# Patient Record
Sex: Male | Born: 1940 | Race: White | Hispanic: No | Marital: Married | Smoking: Former smoker
Health system: Southern US, Community
[De-identification: ages and names within clinical notes are randomized; demographics above are authoritative.]

## PROBLEM LIST (undated history)

## (undated) DIAGNOSIS — N400 Enlarged prostate without lower urinary tract symptoms: Secondary | ICD-10-CM

## (undated) DIAGNOSIS — K219 Gastro-esophageal reflux disease without esophagitis: Secondary | ICD-10-CM

## (undated) HISTORY — DX: Benign prostatic hyperplasia without lower urinary tract symptoms: N40.0

## (undated) HISTORY — PX: HERNIA REPAIR: SHX51

## (undated) HISTORY — DX: Gastro-esophageal reflux disease without esophagitis: K21.9

---

## 1987-08-23 HISTORY — PX: LAMINECTOMY: SHX219

## 2005-06-21 ENCOUNTER — Ambulatory Visit: Payer: Self-pay | Admitting: Internal Medicine

## 2005-06-23 ENCOUNTER — Ambulatory Visit: Payer: Self-pay

## 2005-11-15 ENCOUNTER — Ambulatory Visit: Payer: Self-pay | Admitting: General Surgery

## 2006-08-30 ENCOUNTER — Ambulatory Visit: Payer: Self-pay | Admitting: Surgery

## 2007-01-05 ENCOUNTER — Ambulatory Visit: Payer: Self-pay | Admitting: Unknown Physician Specialty

## 2007-05-17 ENCOUNTER — Ambulatory Visit: Payer: Self-pay | Admitting: Internal Medicine

## 2008-11-26 ENCOUNTER — Ambulatory Visit: Payer: Self-pay | Admitting: Internal Medicine

## 2009-04-29 ENCOUNTER — Ambulatory Visit: Payer: Self-pay | Admitting: Surgery

## 2009-05-04 ENCOUNTER — Ambulatory Visit: Payer: Self-pay | Admitting: Surgery

## 2012-06-05 ENCOUNTER — Ambulatory Visit: Payer: Self-pay | Admitting: Unknown Physician Specialty

## 2014-01-20 DIAGNOSIS — K279 Peptic ulcer, site unspecified, unspecified as acute or chronic, without hemorrhage or perforation: Secondary | ICD-10-CM | POA: Insufficient documentation

## 2014-01-20 DIAGNOSIS — E7800 Pure hypercholesterolemia, unspecified: Secondary | ICD-10-CM | POA: Insufficient documentation

## 2016-05-18 ENCOUNTER — Encounter: Payer: Self-pay | Admitting: Urology

## 2016-05-18 ENCOUNTER — Ambulatory Visit (INDEPENDENT_AMBULATORY_CARE_PROVIDER_SITE_OTHER): Payer: Medicare Other | Admitting: Urology

## 2016-05-18 VITALS — BP 135/82 | HR 69 | Ht 71.0 in | Wt 174.5 lb

## 2016-05-18 DIAGNOSIS — R972 Elevated prostate specific antigen [PSA]: Secondary | ICD-10-CM

## 2016-05-18 DIAGNOSIS — N138 Other obstructive and reflux uropathy: Secondary | ICD-10-CM

## 2016-05-18 DIAGNOSIS — N401 Enlarged prostate with lower urinary tract symptoms: Secondary | ICD-10-CM | POA: Diagnosis not present

## 2016-05-18 DIAGNOSIS — R3912 Poor urinary stream: Secondary | ICD-10-CM | POA: Diagnosis not present

## 2016-05-18 DIAGNOSIS — R351 Nocturia: Secondary | ICD-10-CM

## 2016-05-18 MED ORDER — ALFUZOSIN HCL ER 10 MG PO TB24: 10.0000 mg | ORAL_TABLET | Freq: Every day | ORAL | 11 refills | Status: DC

## 2016-05-18 NOTE — Progress Notes (Signed)
05/18/2016 2:15 PM   Tim RuizJohn Marylu LundM Barrett 01/02/1941 [id number]  Referring provider: Rafael BihariJohn B Walker III, MD [phone] [facility] [place], [place] [address]  Chief Complaint  Patient presents with  . New Patient (Initial Visit)    nocturia, elevated PSA     HPI: Referred for elevated PSA. His 04/21/2016 PSA was 4.37. He does not recall prior PSA levels or PSA screening. No FH PCa.   He has a h/o BPH with LUTS (dysuria, weak stream, hesitancy, intermittent, straining, nocturia, frequency) and ED. He was started on daily Cialis Dec 2016. He also tried tamsulosin, but stopped it due to side effects (made it him feel "bad"). He stopped daily Cialis - no change in symptoms. He's still sexually active. He stays active and works out frequently.    He could not leave a specimen today.    PMH: No past medical history on file.  Surgical History: No past surgical history on file.  Home Medications:    Medication List       Accurate as of 05/18/16  2:15 PM. Always use your most recent med list.          esomeprazole 20 MG capsule Commonly known as:  NEXIUM Take by mouth.       Allergies: No Known Allergies  Family History: No family history on file.  Social History:  has no tobacco, alcohol, and drug history on file.  ROS: UROLOGY Frequent Urination?: Yes Hard to postpone urination?: No Burning/pain with urination?: Yes Get up at night to urinate?: Yes Leakage of urine?: No Urine stream starts and stops?: Yes Trouble starting stream?: Yes Do you have to strain to urinate?: Yes Blood in urine?: No Urinary tract infection?: No Sexually transmitted disease?: No Injury to kidneys or bladder?: No Painful intercourse?: No Weak stream?: Yes Erection problems?: No Penile pain?: No  Gastrointestinal Nausea?: No Vomiting?: No Indigestion/heartburn?: Yes Diarrhea?: No Constipation?: No  Constitutional Fever: No Night sweats?: No Weight loss?:  Yes Fatigue?: No  Skin Skin rash/lesions?: No Itching?: No  Eyes Blurred vision?: No Double vision?: No  Ears/Nose/Throat Sore throat?: Yes Sinus problems?: Yes  Hematologic/Lymphatic Swollen glands?: No Easy bruising?: No  Cardiovascular Leg swelling?: No Chest pain?: No  Respiratory Cough?: No Shortness of breath?: No  Endocrine Excessive thirst?: Yes  Musculoskeletal Back pain?: No Joint pain?: No  Neurological Headaches?: No Dizziness?: No  Psychologic Depression?: No Anxiety?: No  Physical Exam: BP 135/82   Pulse 69   Ht 5\' 11"  (1.803 Tim)   Wt 79.2 kg (174 lb 8 oz)   BMI 24.34 kg/Tim   Constitutional:  Alert and oriented, No acute distress. HEENT: [place] AT, moist mucus membranes.  Trachea midline, no masses. Cardiovascular: No clubbing, cyanosis, or edema. Respiratory: Normal respiratory effort, no increased work of breathing. GI: Abdomen is soft, nontender, nondistended, no abdominal masses Skin: No rashes, bruises or suspicious lesions. Lymph: No cervical or inguinal adenopathy. Neurologic: Grossly intact, no focal deficits, moving all 4 extremities. Psychiatric: Normal mood and affect. DRE: prostate about 80 grams, smooth, no hard area or nodule.   Laboratory Data: No results found for: WBC, HGB, HCT, MCV, PLT  No results found for: CREATININE  No results found for: PSA  No results found for: TESTOSTERONE  No results found for: HGBA1C  Urinalysis No results found for: COLORURINE, APPEARANCEUR, LABSPEC, PHURINE, GLUCOSEU, HGBUR, BILIRUBINUR, KETONESUR, PROTEINUR, UROBILINOGEN, NITRITE, LEUKOCYTESUR  Pertinent Imaging:  Assessment & Plan:    1) PSA -  pt will normal DRE today and we discussed certainly a normal PSA density. We discussed nature of PSA elevation - benign vs malignant. Discussed psa levels and age specific levels. Discussed nature r/b of surveillance, other lab tests, MRI and prostate bx. Discussed management of PCa might  include AS vs treatment (surgery or xrt). Pt elects to continue surveillance. He already has LUTS and ED and would be hesitant to do anything to interfere with QOL and sexual fxn.   2) BPH with LUTS - discussed nature r/b of some other options alfuzosin, 5ARI, procedures. He elected to start alfuzosin. He does not want 5ARI as he has a good libido and is sexually active.   See back in 3 mo for PSA recheck, symptom check and PVR.   There are no diagnoses linked to this encounter.  No Follow-up on file.  Jerilee Field, MD  [facility] [address], [address] [place], [place] [address] [phone]

## 2016-08-01 ENCOUNTER — Ambulatory Visit (INDEPENDENT_AMBULATORY_CARE_PROVIDER_SITE_OTHER): Payer: Medicare Other | Admitting: Urology

## 2016-08-01 VITALS — BP 155/99 | HR 71 | Ht 71.0 in | Wt 180.0 lb

## 2016-08-01 DIAGNOSIS — R3912 Poor urinary stream: Secondary | ICD-10-CM

## 2016-08-01 DIAGNOSIS — N401 Enlarged prostate with lower urinary tract symptoms: Secondary | ICD-10-CM

## 2016-08-01 DIAGNOSIS — R972 Elevated prostate specific antigen [PSA]: Secondary | ICD-10-CM

## 2016-08-01 DIAGNOSIS — N138 Other obstructive and reflux uropathy: Secondary | ICD-10-CM

## 2016-08-01 LAB — BLADDER SCAN AMB NON-IMAGING

## 2016-08-01 NOTE — Progress Notes (Signed)
08/01/2016 2:54 PM   Tim RuizJohn Marylu LundM Barrett 04/07/1941 [id number]  Referring provider: Rafael BihariJohn B Walker III, MD [phone] [facility] [address] [facility] [place], [place] [address]  Chief Complaint  Patient presents with  . Benign Prostatic Hypertrophy    74month    HPI: 1)  elevated PSA - His 04/21/2016 PSA was 4.37. He does not recall prior PSA levels or PSA screening. No FH PCa. His DRE was normal 04/2016, prostate ~80 grams.   2) BPH with LUTS (dysuria, weak stream, hesitancy, intermittent, straining, nocturia, frequency) and ED. He was started on daily Cialis Dec 2016. He also tried tamsulosin, but stopped it due to side effects (made it him feel "bad"). He stopped daily Cialis - no change in symptoms. He's still sexually active. He stays active and works out frequently. He tried alfuzosin 9/2017l   Today, Tim Barrett is seen for the above. He tried Alfuzosin and it significantly improved his voiding symptoms so much so he only takes it when he needs it (every 4-5 days). Post void today is 177 mL. He could not leave a urine specimen.    PMH: Past Medical History:  Diagnosis Date  . Acid reflux     Surgical History: Past Surgical History:  Procedure Laterality Date  . HERNIA REPAIR     x4  . LAMINECTOMY  1989    Home Medications:    Medication List       Accurate as of 08/01/16  2:54 PM. Always use your most recent med list.          alfuzosin 10 MG 24 hr tablet Commonly known as:  UROXATRAL Take 1 tablet (10 mg total) by mouth daily with breakfast.   esomeprazole 20 MG capsule Commonly known as:  NEXIUM Take by mouth.       Allergies: No Known Allergies  Family History: Family History  Problem Relation Age of Onset  . Prostate cancer Neg Hx   . Hematuria Neg Hx     Social History:  reports that he quit smoking about 15 years ago. He has never used smokeless tobacco. He reports that he does not drink alcohol or use drugs.  ROS: UROLOGY Frequent Urination?:  Yes Hard to postpone urination?: No Burning/pain with urination?: No Get up at night to urinate?: Yes Leakage of urine?: No Urine stream starts and stops?: Yes Trouble starting stream?: Yes Do you have to strain to urinate?: No Blood in urine?: No Urinary tract infection?: No Sexually transmitted disease?: No Injury to kidneys or bladder?: No Painful intercourse?: No Weak stream?: No Erection problems?: No Penile pain?: No  Gastrointestinal Nausea?: No Vomiting?: No Indigestion/heartburn?: No Diarrhea?: No Constipation?: No  Constitutional Fever: No Night sweats?: No Weight loss?: No Fatigue?: No  Skin Skin rash/lesions?: No Itching?: No  Eyes Blurred vision?: No Double vision?: No  Ears/Nose/Throat Sore throat?: No Sinus problems?: Yes  Hematologic/Lymphatic Swollen glands?: No Easy bruising?: No  Cardiovascular Leg swelling?: No Chest pain?: No  Respiratory Cough?: No Shortness of breath?: No  Endocrine Excessive thirst?: No  Musculoskeletal Back pain?: No Joint pain?: No  Neurological Headaches?: No Dizziness?: No  Psychologic Depression?: No Anxiety?: No  Physical Exam: BP (!) 155/99   Pulse 71   Ht 5\' 11"  (1.803 Tim)   Wt 81.6 kg (180 lb)   BMI 25.10 kg/Tim   Constitutional:  Alert and oriented, No acute distress. HEENT: [place] AT, moist mucus membranes.  Trachea midline, no masses. Cardiovascular: No clubbing, cyanosis, or edema.  Respiratory: Normal respiratory effort, no increased work of breathing. GI: Abdomen is soft, nontender, nondistended, no abdominal masses Skin: No rashes, bruises or suspicious lesions. Lymph: No cervical or inguinal adenopathy. Neurologic: Grossly intact, no focal deficits, moving all 4 extremities. Psychiatric: Normal mood and affect.  Laboratory Data: No results found for: WBC, HGB, HCT, MCV, PLT  No results found for: CREATININE  No results found for: PSA  No results found for: TESTOSTERONE  No  results found for: HGBA1C  Urinalysis No results found for: COLORURINE, APPEARANCEUR, LABSPEC, PHURINE, GLUCOSEU, HGBUR, BILIRUBINUR, KETONESUR, PROTEINUR, UROBILINOGEN, NITRITE, LEUKOCYTESUR  Pertinent Imaging:  Assessment & Plan:    1. BPH with obstruction/lower urinary tract symptoms Improved on alfuzosin. Continue. PVR reasonable.  - PSA - BLADDER SCAN AMB NON-IMAGING  2) elevated PSA - PSA was sent.   No Follow-up on file.  Jerilee FieldESKRIDGE, Skylan Lara, MD  [facility] [address], [address] [place], [place] [phone]

## 2016-08-02 ENCOUNTER — Telehealth: Payer: Self-pay

## 2016-08-02 DIAGNOSIS — R972 Elevated prostate specific antigen [PSA]: Secondary | ICD-10-CM

## 2016-08-02 LAB — PSA: PROSTATE SPECIFIC AG, SERUM: 5.2 ng/mL — AB (ref 0.0–4.0)

## 2016-08-02 NOTE — Telephone Encounter (Signed)
Tim Dadatthew Eskridge, MD  [name], LPN        Notify patient his PSA went up about a poin to 5.2. I would recommend a prostate biopsy, but if he wants to avoid that I would recheck the PSA in 6-8 weeks to ensure it remains stable or decreases.    Spoke with pt in reference to PSA results. Pt elected to recheck PSA in 8 weeks. Lab appt made and orders placed.

## 2016-08-11 ENCOUNTER — Ambulatory Visit: Payer: Medicare Other | Admitting: Urology

## 2016-10-03 ENCOUNTER — Other Ambulatory Visit: Payer: Medicare Other

## 2016-10-03 DIAGNOSIS — R972 Elevated prostate specific antigen [PSA]: Secondary | ICD-10-CM

## 2016-10-04 LAB — PSA: PROSTATE SPECIFIC AG, SERUM: 5.1 ng/mL — AB (ref 0.0–4.0)

## 2016-10-07 ENCOUNTER — Telehealth: Payer: Self-pay

## 2016-10-07 DIAGNOSIS — N401 Enlarged prostate with lower urinary tract symptoms: Secondary | ICD-10-CM

## 2016-10-07 NOTE — Telephone Encounter (Signed)
Tim FieldMatthew Eskridge, MD  Tim Latchhelsea C Watkins, LPN        PSA stable - this should be rechecked again in 3 months    Spoke to pt in reference to PSA results. Pt voiced understanding. Lab appt made and orders placed.

## 2017-01-04 ENCOUNTER — Other Ambulatory Visit: Payer: Medicare Other

## 2017-01-04 DIAGNOSIS — N401 Enlarged prostate with lower urinary tract symptoms: Secondary | ICD-10-CM

## 2017-01-05 LAB — PSA: Prostate Specific Ag, Serum: 4.8 ng/mL — ABNORMAL HIGH (ref 0.0–4.0)

## 2017-01-06 ENCOUNTER — Telehealth: Payer: Self-pay

## 2017-01-06 NOTE — Telephone Encounter (Signed)
Jerilee FieldEskridge, Matthew, MD  Skeet LatchWatkins, Chelsea C, LPN        Notify patient - PSA stable, a little lower. F/u as planned   Spoke with pt in reference to PSA results and f/u. Pt voiced understanding.

## 2017-01-31 ENCOUNTER — Encounter: Payer: Self-pay | Admitting: Urology

## 2017-01-31 ENCOUNTER — Ambulatory Visit (INDEPENDENT_AMBULATORY_CARE_PROVIDER_SITE_OTHER): Payer: Medicare Other | Admitting: Urology

## 2017-01-31 VITALS — BP 153/96 | HR 66 | Ht 71.0 in | Wt 179.9 lb

## 2017-01-31 DIAGNOSIS — N401 Enlarged prostate with lower urinary tract symptoms: Secondary | ICD-10-CM | POA: Diagnosis not present

## 2017-01-31 DIAGNOSIS — R350 Frequency of micturition: Secondary | ICD-10-CM | POA: Diagnosis not present

## 2017-01-31 DIAGNOSIS — N138 Other obstructive and reflux uropathy: Secondary | ICD-10-CM | POA: Diagnosis not present

## 2017-01-31 DIAGNOSIS — R972 Elevated prostate specific antigen [PSA]: Secondary | ICD-10-CM | POA: Diagnosis not present

## 2017-01-31 NOTE — Progress Notes (Signed)
.   01/31/2017 2:12 PM   Tim Barrett Tim Barrett 08-29-1940 [id number]  Referring provider: Rafael Bihari, MD [phone] Tim [facility] [address] [facility] [place], [place] [address]  Chief Complaint  Patient presents with  . Benign Prostatic Hypertrophy    6 month fpllow up   . Nocturia    HPI:  1)  elevated PSA - His 04/21/2016 PSA was 4.37. He does not recall prior PSA levels or PSA screening. No FH PCa. His DRE was normal 04/2016, prostate ~80 grams.  PSA hx: Aug 2017 PSA 4.37 Dec 2017 PSA 5.23 Sep 2016  PSA 5.20 Dec 2016 PSA 4.8    2) BPH with LUTS (dysuria, weak stream, hesitancy, intermittent, straining, nocturia, frequency)and ED. He was started on daily Cialis Dec 2016. He also tried tamsulosin, but stopped it due to side effects (made it him feel "bad"). He stopped daily Cialis - no change in symptoms. He's still sexually active. He stays active and works out frequently. He tried alfuzosin 04/2016. PVR was 177 mL.   The patient returns for the above. His lower urinary check symptoms have improved on alfuzosin and he is satisfied. It "took care of it". He still has occasional frequency and nocturia. His AUA symptom score there was dropped to 2, quality of life 2.  PMH: Past Medical History:  Diagnosis Date  . Acid reflux   . BPH (benign prostatic hyperplasia)     Surgical History: Past Surgical History:  Procedure Laterality Date  . HERNIA REPAIR     x4  . LAMINECTOMY  1989    Home Medications:  Allergies as of 01/31/2017   No Known Allergies     Medication List       Accurate as of 01/31/17  2:12 PM. Always use your most recent med list.          alfuzosin 10 MG 24 hr tablet Commonly known as:  UROXATRAL Take 1 tablet (10 mg total) by mouth daily with breakfast.   esomeprazole 20 MG capsule Commonly known as:  NEXIUM Take by mouth.       Allergies: No Known Allergies  Family History: Family History  Problem Relation Age of Onset  . Heart attack  Father   . Prostate cancer Neg Hx   . Hematuria Neg Hx   . Kidney cancer Neg Hx   . Bladder Cancer Neg Hx     Social History:  reports that he quit smoking about 15 years ago. He has never used smokeless tobacco. He reports that he does not drink alcohol or use drugs.  ROS: UROLOGY Frequent Urination?: Yes Hard to postpone urination?: No Burning/pain with urination?: No Get up at night to urinate?: Yes Leakage of urine?: No Urine stream starts and stops?: No Trouble starting stream?: No Do you have to strain to urinate?: No Blood in urine?: No Urinary tract infection?: No Sexually transmitted disease?: No Injury to kidneys or bladder?: No Painful intercourse?: No Weak stream?: No Erection problems?: No Penile pain?: No  Gastrointestinal Nausea?: No Vomiting?: No Indigestion/heartburn?: No Diarrhea?: No Constipation?: No  Constitutional Fever: No Night sweats?: No Weight loss?: No Fatigue?: No  Skin Skin rash/lesions?: No Itching?: No  Eyes Blurred vision?: No Double vision?: No  Ears/Nose/Throat Sore throat?: No Sinus problems?: Yes  Hematologic/Lymphatic Swollen glands?: No Easy bruising?: No  Cardiovascular Leg swelling?: No Chest pain?: No  Respiratory Cough?: No Shortness of breath?: No  Endocrine Excessive thirst?: No  Musculoskeletal Back pain?: No Joint pain?:  No  Neurological Headaches?: No Dizziness?: No  Psychologic Depression?: No Anxiety?: No  Physical Exam: BP (!) 153/96   Pulse 66   Ht 5\' 11"  (1.803 m)   Wt 81.6 kg (179 lb 14.4 oz)   BMI 25.09 kg/m   Constitutional:  Alert and oriented, No acute distress. HEENT: [place] AT, moist mucus membranes.  Trachea midline, no masses. Cardiovascular: No clubbing, cyanosis, or edema. Respiratory: Normal respiratory effort, no increased work of breathing. GI: Abdomen is soft, nontender, nondistended, no abdominal mass Skin: No rashes, bruises or suspicious lesions. Lymph: No  cervical or inguinal adenopathy. Neurologic: Grossly intact, no focal deficits, moving all 4 extremities. Psychiatric: Normal mood and affect.  Laboratory Data: No results found for: WBC, HGB, HCT, MCV, PLT  No results found for: CREATININE  No results found for: PSA  No results found for: TESTOSTERONE  No results found for: HGBA1C  Urinalysis No results found for: COLORURINE, APPEARANCEUR, LABSPEC, PHURINE, GLUCOSEU, HGBUR, BILIRUBINUR, KETONESUR, PROTEINUR, UROBILINOGEN, NITRITE, LEUKOCYTESUR   Assessment & Plan:    1) elevated PSA-we discussed his PSA remains elevated and the nature of elevated PSA-benign versus malignant. Again we discussed the nature risk and benefits of surveillance versus other lab tests MRI or biopsy. Looking at the PCP T data, min his age and was similar PSA levels had a low-grade biopsy positive at about 21% and high-grade at about 11%. Negative rate was 68%. He favors surveillance.   2) BPH-patient stable on alfuzosiin.  See back in 6 mo with a PSA prior.   There are no diagnoses linked to this encounter.  No Follow-up on file.  Jerilee FieldESKRIDGE, Anitra Doxtater, MD  [facility] [address], [address] [place], [place] [address] [phone]

## 2017-07-25 ENCOUNTER — Other Ambulatory Visit: Payer: Medicare Other

## 2017-08-01 ENCOUNTER — Ambulatory Visit: Payer: Medicare Other

## 2017-08-07 ENCOUNTER — Ambulatory Visit: Payer: Medicare Other

## 2018-02-06 ENCOUNTER — Telehealth: Payer: Self-pay | Admitting: Family Medicine

## 2018-02-06 NOTE — Telephone Encounter (Signed)
LMOM for patient to return call.

## 2018-02-06 NOTE — Telephone Encounter (Signed)
-----   Message from Jerilee FieldMatthew Eskridge, MD sent at 02/06/2018 11:22 AM EDT ----- Notify patient -- due for OV and PSA/DRE check   ----- Message ----- From: SYSTEM Sent: 02/05/2018  12:07 AM To: Jerilee FieldMatthew Eskridge, MD

## 2018-02-08 NOTE — Telephone Encounter (Signed)
lmom for pt to call for appt

## 2020-11-16 ENCOUNTER — Other Ambulatory Visit: Payer: Self-pay | Admitting: Internal Medicine

## 2020-11-16 DIAGNOSIS — M899 Disorder of bone, unspecified: Secondary | ICD-10-CM

## 2020-12-11 ENCOUNTER — Ambulatory Visit
Admission: RE | Admit: 2020-12-11 | Discharge: 2020-12-11 | Disposition: A | Discharge status: Home / Self Care | Payer: Medicare PPO | Source: Ambulatory Visit | Attending: Internal Medicine | Admitting: Internal Medicine

## 2020-12-11 ENCOUNTER — Other Ambulatory Visit: Payer: Self-pay

## 2020-12-11 DIAGNOSIS — R9082 White matter disease, unspecified: Secondary | ICD-10-CM | POA: Diagnosis not present

## 2020-12-11 DIAGNOSIS — G319 Degenerative disease of nervous system, unspecified: Secondary | ICD-10-CM | POA: Diagnosis not present

## 2020-12-11 DIAGNOSIS — M899 Disorder of bone, unspecified: Secondary | ICD-10-CM | POA: Diagnosis not present

## 2022-09-17 IMAGING — CT CT HEAD W/O CM
4 series · 15 of 47 positions shown, 17 images · non-contrast
Comparison: None.

CLINICAL DATA: Bump on head

EXAM:
CT HEAD WITHOUT CONTRAST
TECHNIQUE: Contiguous axial images were obtained from the base of the skull
through the vertex without intravenous contrast.

[Series 2: axial st head 5.00 ax · axial · 0.37mm/px · z∈[-553,-437]mm · 7 of 38 slices shown, 9 images]
[im 5/38  brain]
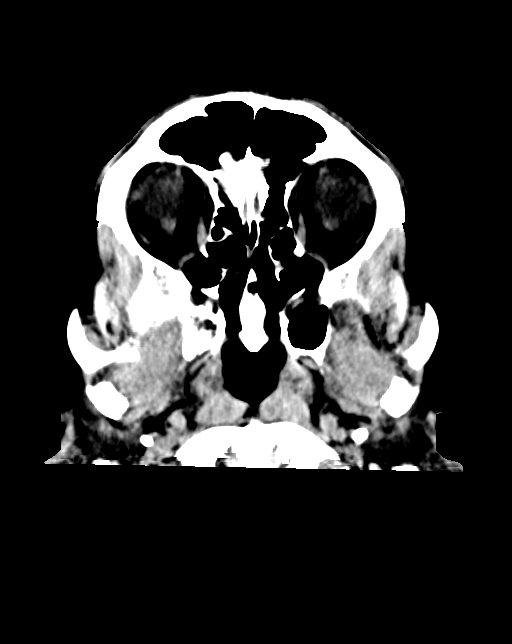
[im 5/38  bone]
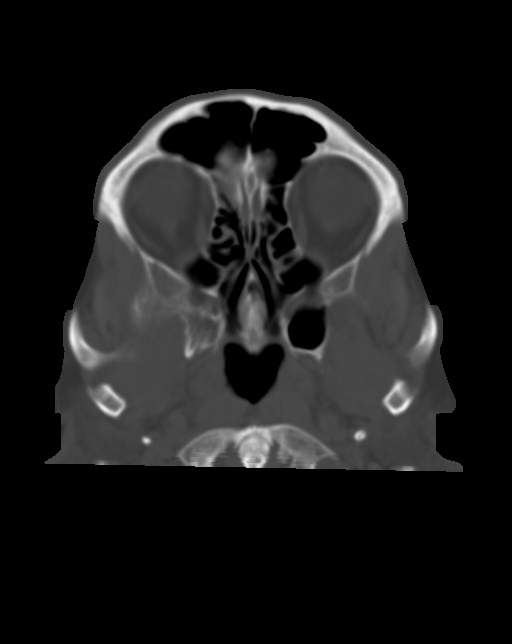
[im 10/38  brain]
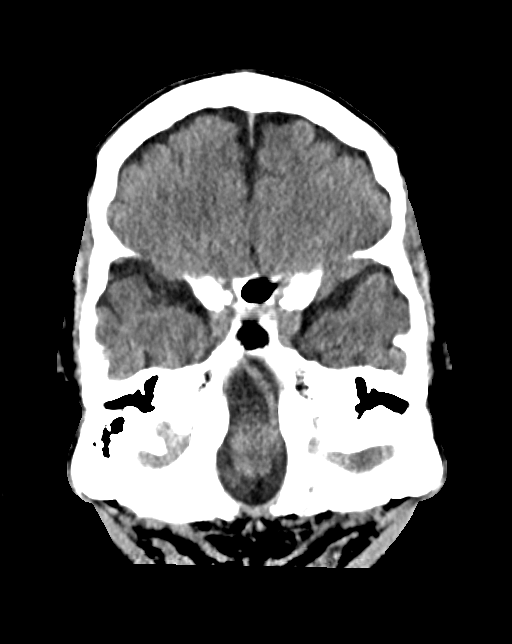
[im 14/38  brain]
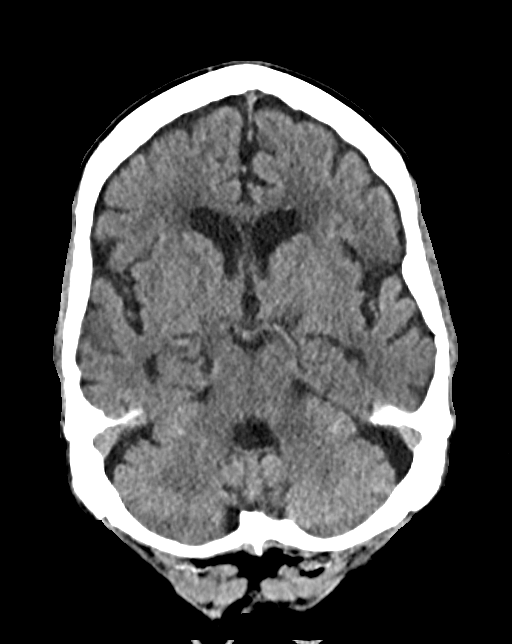
[im 19/38  brain]
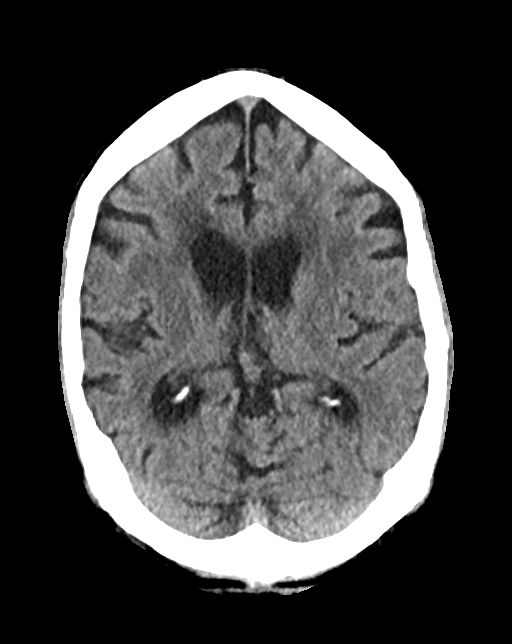
[im 24/38  brain]
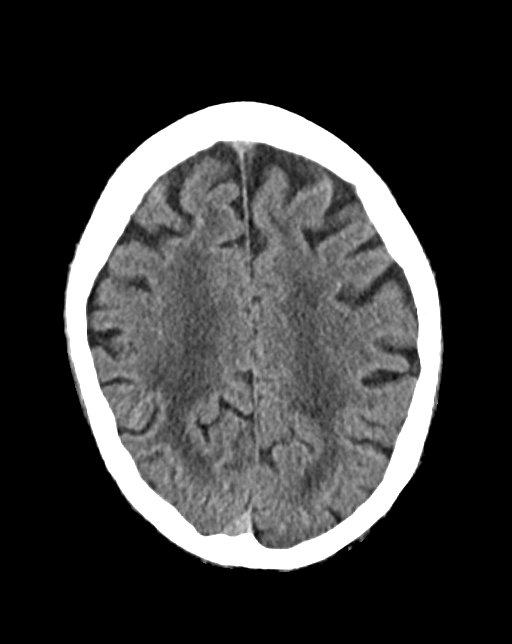
[im 24/38  bone]
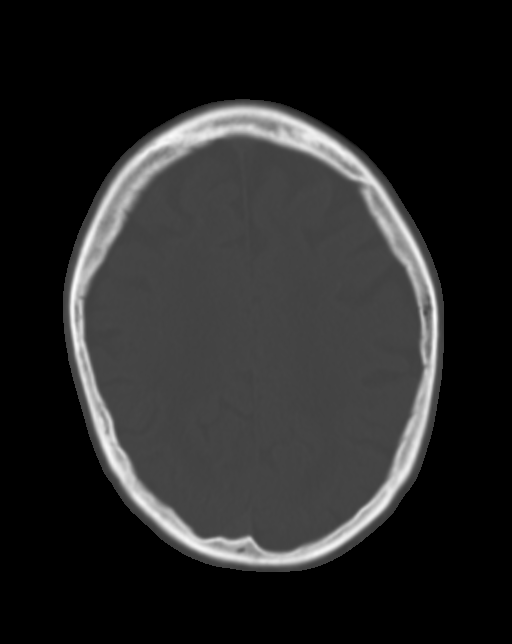
[im 28/38  brain]
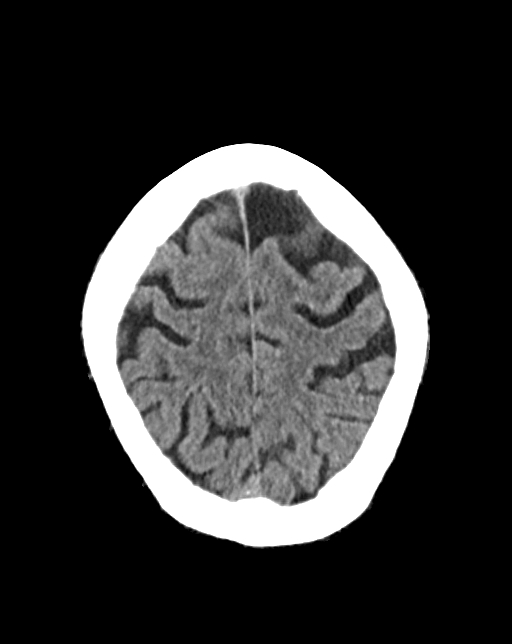
[im 33/38  brain]
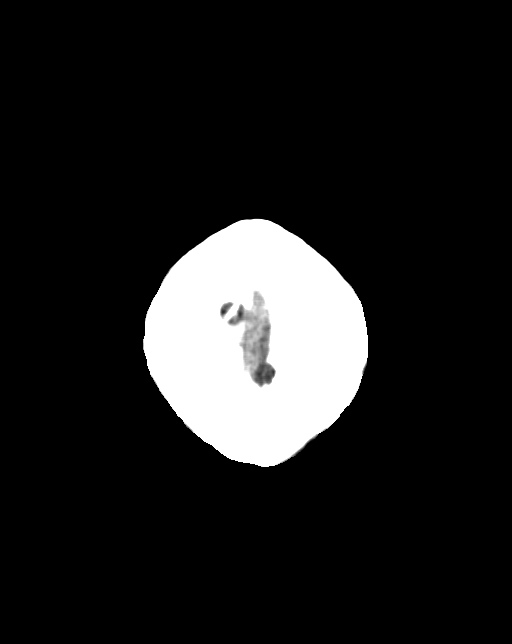

[Series 4: bone windows head 2.00 ax · axial · 0.37mm/px · z∈[-555,-539]mm · 2 of 96 slices shown]
[im 10/96  bone]
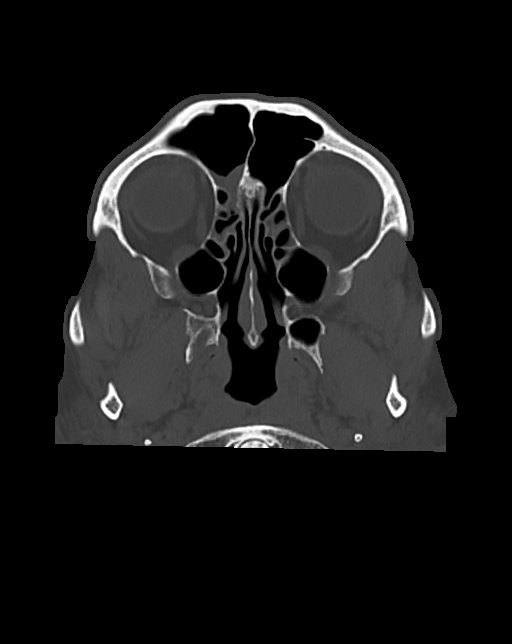
[im 20/96  bone]
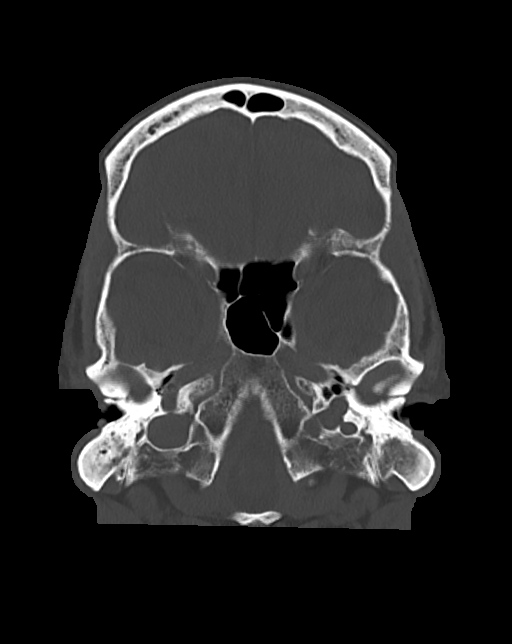

[Series 6: coronals head 3.00 cor · coronal · 0.37mm/px · 3 of 79 slices shown]
[im 32/79  brain]
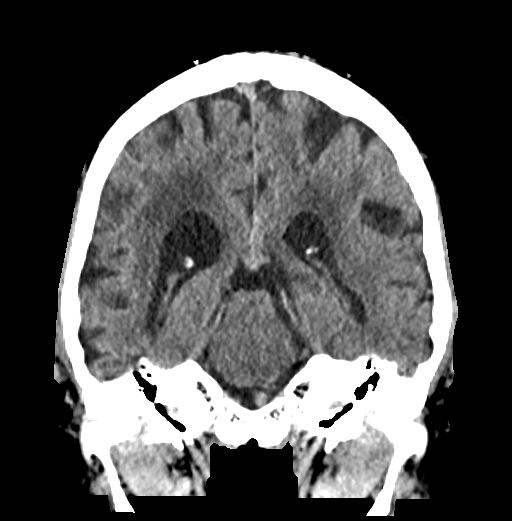
[im 37/79  brain]
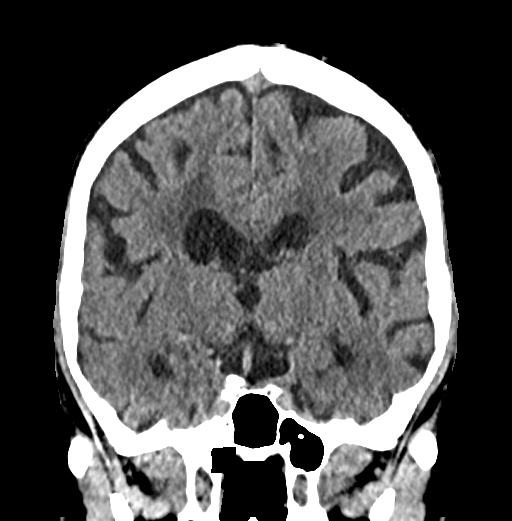
[im 42/79  brain]
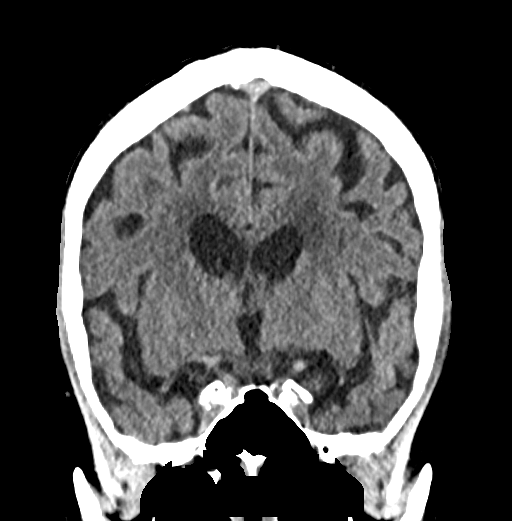

[Series 8: sagittals head 3.00 sag · sagittal · 0.38mm/px · 3 of 63 slices shown]
[im 21/63  brain]
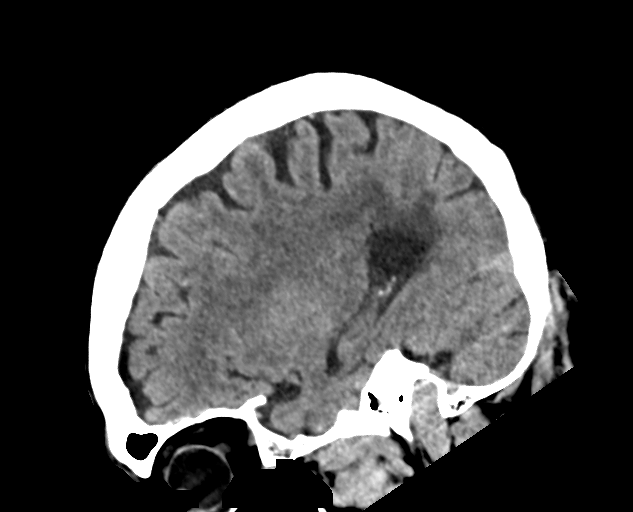
[im 32/63  brain]
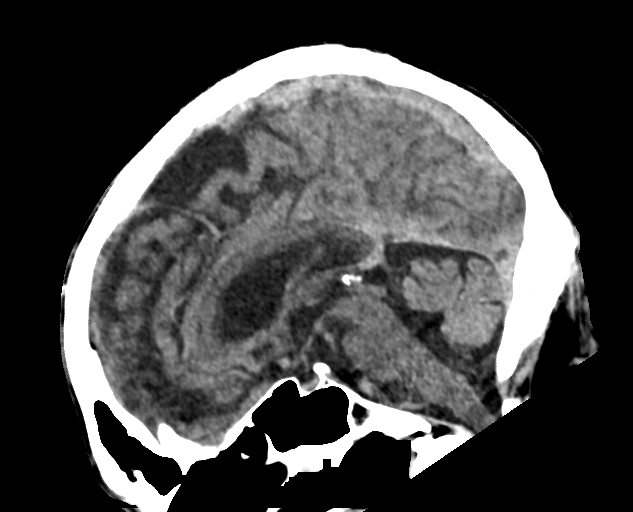
[im 42/63  brain]
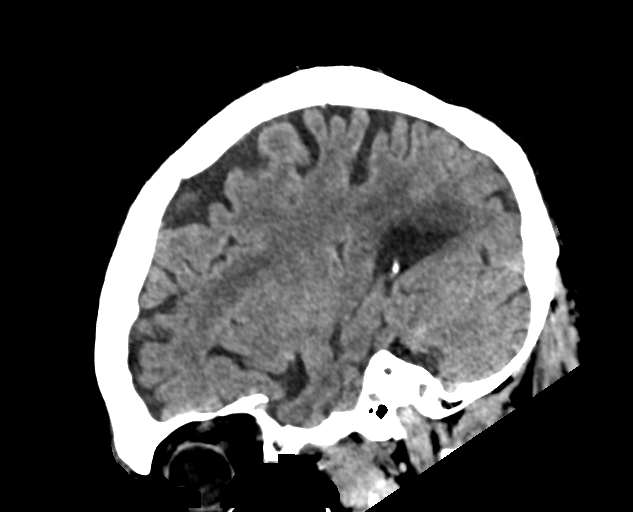

[15 of 47 positions shown; findings below may reference images not displayed]

FINDINGS: Brain: Diffuse parenchymal atrophy. Patchy areas of hypoattenuation
in deep and periventricular white matter bilaterally. Negative for
acute intracranial hemorrhage, mass lesion, acute infarction,
midline shift, or mass-effect. Acute infarct may be inapparent on
noncontrast CT. Ventricles and sulci symmetric.

Vascular: Atherosclerotic and physiologic intracranial
calcifications.

Skull: Normal. Negative for fracture or focal lesion.

Sinuses/Orbits: No acute finding.

Other: None
IMPRESSION: 1. Negative for bleed or other acute intracranial process.
2. Atrophy and nonspecific white matter changes.

## 2022-10-14 ENCOUNTER — Emergency Department: Payer: Medicare PPO

## 2022-10-14 ENCOUNTER — Inpatient Hospital Stay
Admission: EM | Admit: 2022-10-14 | Discharge: 2022-11-02 | DRG: 884 | Disposition: A | Discharge status: Hospice | Payer: Medicare PPO | Attending: Internal Medicine | Admitting: Internal Medicine

## 2022-10-14 DIAGNOSIS — I700 Atherosclerosis of aorta: Secondary | ICD-10-CM | POA: Diagnosis present

## 2022-10-14 DIAGNOSIS — S21112A Laceration without foreign body of left front wall of thorax without penetration into thoracic cavity, initial encounter: Secondary | ICD-10-CM | POA: Diagnosis not present

## 2022-10-14 DIAGNOSIS — S71111A Laceration without foreign body, right thigh, initial encounter: Secondary | ICD-10-CM | POA: Diagnosis not present

## 2022-10-14 DIAGNOSIS — Z23 Encounter for immunization: Secondary | ICD-10-CM

## 2022-10-14 DIAGNOSIS — E870 Hyperosmolality and hypernatremia: Secondary | ICD-10-CM | POA: Diagnosis not present

## 2022-10-14 DIAGNOSIS — S51812A Laceration without foreign body of left forearm, initial encounter: Secondary | ICD-10-CM | POA: Diagnosis present

## 2022-10-14 DIAGNOSIS — E785 Hyperlipidemia, unspecified: Secondary | ICD-10-CM | POA: Diagnosis present

## 2022-10-14 DIAGNOSIS — F03911 Unspecified dementia, unspecified severity, with agitation: Secondary | ICD-10-CM | POA: Diagnosis present

## 2022-10-14 DIAGNOSIS — N179 Acute kidney failure, unspecified: Secondary | ICD-10-CM | POA: Diagnosis present

## 2022-10-14 DIAGNOSIS — Z7982 Long term (current) use of aspirin: Secondary | ICD-10-CM

## 2022-10-14 DIAGNOSIS — Z515 Encounter for palliative care: Secondary | ICD-10-CM

## 2022-10-14 DIAGNOSIS — F03918 Unspecified dementia, unspecified severity, with other behavioral disturbance: Secondary | ICD-10-CM | POA: Diagnosis not present

## 2022-10-14 DIAGNOSIS — E876 Hypokalemia: Secondary | ICD-10-CM | POA: Diagnosis present

## 2022-10-14 DIAGNOSIS — D72829 Elevated white blood cell count, unspecified: Secondary | ICD-10-CM | POA: Diagnosis present

## 2022-10-14 DIAGNOSIS — K279 Peptic ulcer, site unspecified, unspecified as acute or chronic, without hemorrhage or perforation: Secondary | ICD-10-CM | POA: Diagnosis present

## 2022-10-14 DIAGNOSIS — Z79899 Other long term (current) drug therapy: Secondary | ICD-10-CM

## 2022-10-14 DIAGNOSIS — I2510 Atherosclerotic heart disease of native coronary artery without angina pectoris: Secondary | ICD-10-CM | POA: Diagnosis present

## 2022-10-14 DIAGNOSIS — Z882 Allergy status to sulfonamides status: Secondary | ICD-10-CM

## 2022-10-14 DIAGNOSIS — E860 Dehydration: Secondary | ICD-10-CM | POA: Diagnosis present

## 2022-10-14 DIAGNOSIS — T1491XA Suicide attempt, initial encounter: Secondary | ICD-10-CM | POA: Diagnosis present

## 2022-10-14 DIAGNOSIS — Z1152 Encounter for screening for COVID-19: Secondary | ICD-10-CM

## 2022-10-14 DIAGNOSIS — Z87891 Personal history of nicotine dependence: Secondary | ICD-10-CM

## 2022-10-14 DIAGNOSIS — X789XXA Intentional self-harm by unspecified sharp object, initial encounter: Secondary | ICD-10-CM | POA: Diagnosis present

## 2022-10-14 DIAGNOSIS — Z8249 Family history of ischemic heart disease and other diseases of the circulatory system: Secondary | ICD-10-CM

## 2022-10-14 DIAGNOSIS — F0394 Unspecified dementia, unspecified severity, with anxiety: Secondary | ICD-10-CM | POA: Diagnosis present

## 2022-10-14 DIAGNOSIS — Z66 Do not resuscitate: Secondary | ICD-10-CM | POA: Diagnosis present

## 2022-10-14 DIAGNOSIS — K219 Gastro-esophageal reflux disease without esophagitis: Secondary | ICD-10-CM | POA: Diagnosis present

## 2022-10-14 DIAGNOSIS — N400 Enlarged prostate without lower urinary tract symptoms: Secondary | ICD-10-CM | POA: Diagnosis present

## 2022-10-14 LAB — COMPREHENSIVE METABOLIC PANEL
ALT: 15 U/L (ref 0–44)
AST: 25 U/L (ref 15–41)
Albumin: 4.1 g/dL (ref 3.5–5.0)
Alkaline Phosphatase: 60 U/L (ref 38–126)
Anion gap: 12 (ref 5–15)
BUN: 10 mg/dL (ref 8–23)
CO2: 22 mmol/L (ref 22–32)
Calcium: 9.2 mg/dL (ref 8.9–10.3)
Chloride: 103 mmol/L (ref 98–111)
Creatinine, Ser: 0.92 mg/dL (ref 0.61–1.24)
GFR, Estimated: >60 mL/min (ref >60)
Glucose, Bld: 112 mg/dL — ABNORMAL HIGH (ref 70–99)
Potassium: 3.4 mmol/L — ABNORMAL LOW (ref 3.5–5.1)
Sodium: 137 mmol/L (ref 135–145)
Total Bilirubin: 1.6 mg/dL — ABNORMAL HIGH (ref 0.3–1.2)
Total Protein: 7 g/dL (ref 6.5–8.1)

## 2022-10-14 LAB — CBC
HCT: 38.9 % — ABNORMAL LOW (ref 39.0–52.0)
Hemoglobin: 13.3 g/dL (ref 13.0–17.0)
MCH: 31.6 pg (ref 26.0–34.0)
MCHC: 34.2 g/dL (ref 30.0–36.0)
MCV: 92.4 fL (ref 80.0–100.0)
Platelets: 193 10*3/uL (ref 150–400)
RBC: 4.21 MIL/uL — ABNORMAL LOW (ref 4.22–5.81)
RDW: 12.7 % (ref 11.5–15.5)
WBC: 7.8 10*3/uL (ref 4.0–10.5)
nRBC: 0 % (ref 0.0–0.2)

## 2022-10-14 LAB — ACETAMINOPHEN LEVEL: Acetaminophen (Tylenol), Serum: <10 ug/mL — ABNORMAL LOW (ref 10–30)

## 2022-10-14 LAB — URINE DRUG SCREEN, QUALITATIVE (ARMC ONLY)
Amphetamines, Ur Screen: NOT DETECTED
Barbiturates, Ur Screen: NOT DETECTED
Benzodiazepine, Ur Scrn: NOT DETECTED
Cannabinoid 50 Ng, Ur ~~LOC~~: NOT DETECTED
Cocaine Metabolite,Ur ~~LOC~~: NOT DETECTED
MDMA (Ecstasy)Ur Screen: NOT DETECTED
Methadone Scn, Ur: NOT DETECTED
Opiate, Ur Screen: NOT DETECTED
Phencyclidine (PCP) Ur S: NOT DETECTED
Tricyclic, Ur Screen: NOT DETECTED

## 2022-10-14 LAB — ETHANOL: Alcohol, Ethyl (B): <10 mg/dL (ref <10)

## 2022-10-14 LAB — SALICYLATE LEVEL: Salicylate Lvl: <7 mg/dL — ABNORMAL LOW (ref 7.0–30.0)

## 2022-10-14 MED ORDER — BACITRACIN ZINC 500 UNIT/GM EX OINT
TOPICAL_OINTMENT | Freq: Once | CUTANEOUS | Status: AC
  Filled 2022-10-14: qty 0.9

## 2022-10-14 MED ORDER — LIDOCAINE-EPINEPHRINE-TETRACAINE (LET) TOPICAL GEL
3.0000 mL | Freq: Once | TOPICAL | Status: AC
  Administered 2022-10-14: 3 mL via TOPICAL
  Filled 2022-10-14: qty 3

## 2022-10-14 MED ORDER — IOHEXOL 350 MG/ML SOLN
125.0000 mL | Freq: Once | INTRAVENOUS | Status: AC | PRN
  Administered 2022-10-14: 125 mL via INTRAVENOUS

## 2022-10-14 MED ORDER — HALOPERIDOL LACTATE 5 MG/ML IJ SOLN
5.0000 mg | Freq: Once | INTRAMUSCULAR | Status: AC
  Administered 2022-10-14: 5 mg via INTRAMUSCULAR
  Filled 2022-10-14: qty 1

## 2022-10-14 MED ORDER — TETANUS-DIPHTH-ACELL PERTUSSIS 5-2.5-18.5 LF-MCG/0.5 IM SUSY
0.5000 mL | PREFILLED_SYRINGE | Freq: Once | INTRAMUSCULAR | Status: AC
  Administered 2022-10-14: 0.5 mL via INTRAMUSCULAR
  Filled 2022-10-14: qty 0.5

## 2022-10-14 MED ORDER — ZIPRASIDONE MESYLATE 20 MG IM SOLR
10.0000 mg | Freq: Once | INTRAMUSCULAR | Status: AC
  Administered 2022-10-14: 10 mg via INTRAMUSCULAR
  Filled 2022-10-14: qty 20

## 2022-10-14 MED ORDER — MIDAZOLAM HCL 2 MG/2ML IJ SOLN
1.0000 mg | Freq: Once | INTRAMUSCULAR | Status: AC
  Administered 2022-10-14: 1 mg via INTRAMUSCULAR
  Filled 2022-10-14: qty 2

## 2022-10-14 NOTE — ED Notes (Signed)
Pt continuing to be very upset and trying to get out of bed. Pt's currently on the phone with his wife to see if that will help calm him down.

## 2022-10-14 NOTE — ED Notes (Signed)
IVC/Psych Consult Ordered/Pending

## 2022-10-14 NOTE — ED Notes (Signed)
ED Provider at bedside. 

## 2022-10-14 NOTE — ED Notes (Signed)
MD notified this RN that pt will be "medically cleared" Charge RN notified this RN that when pt is cleared he will be moved to a psych room. This RN notified pt's wife of the plan and she sts she will go home when pt is moved to the different room.
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
## 2022-10-14 NOTE — ED Notes (Signed)
BELONGINGS: Agricultural engineer socks 1 blue comb 2 brown wallets Pair of keys Electrical engineer Grey/white hat

## 2022-10-14 NOTE — ED Notes (Signed)
This RN and multiple staff have been at the patient's bedside for the last 60+ mins. Pt has successfully  gotten out of bed 3 x and has grabbed staff trying to leave. Pt sts that he wants to go to his car. Pt has been re-oriented multiple times but refuses to believe that this is a Hospital. Pt spoke with his wife for approx 20 mins. Pt continues to be agitated. Dr. Quentin Cornwall has been kept notified of the staffs efforts. Pt now has 2 techs at the bedside.
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
## 2022-10-14 NOTE — ED Notes (Signed)
Wife sts to please use the phone number ending in [phone] to speak with the wife.

## 2022-10-14 NOTE — ED Notes (Signed)
Pt became very upset when his wife left and was attempting to get out of bed. MD notified and this RN medicated pt.

## 2022-10-14 NOTE — ED Notes (Signed)
Wife at bedside. Pt denies pain and lying in bed. Interacting appropriately.
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
## 2022-10-14 NOTE — ED Notes (Signed)
Pt has finally appeared to calm down. Sitter at bedside.

## 2022-10-15 DIAGNOSIS — F03918 Unspecified dementia, unspecified severity, with other behavioral disturbance: Secondary | ICD-10-CM | POA: Diagnosis present

## 2022-10-15 DIAGNOSIS — F03911 Unspecified dementia, unspecified severity, with agitation: Secondary | ICD-10-CM | POA: Diagnosis not present

## 2022-10-15 DIAGNOSIS — T1491XA Suicide attempt, initial encounter: Secondary | ICD-10-CM | POA: Diagnosis not present

## 2022-10-15 LAB — VALPROIC ACID LEVEL: Valproic Acid Lvl: 23 ug/mL — ABNORMAL LOW (ref 50.0–100.0)

## 2022-10-15 MED ORDER — ALFUZOSIN HCL ER 10 MG PO TB24
10.0000 mg | ORAL_TABLET | Freq: Every day | ORAL | Status: DC
  Administered 2022-10-16 – 2022-10-22 (×4): 10 mg via ORAL
  Filled 2022-10-15 (×7): qty 1

## 2022-10-15 MED ORDER — DROPERIDOL 2.5 MG/ML IJ SOLN
2.5000 mg | Freq: Once | INTRAMUSCULAR | Status: AC
  Administered 2022-10-15: 2.5 mg via INTRAVENOUS
  Filled 2022-10-15: qty 2

## 2022-10-15 MED ORDER — DIVALPROEX SODIUM 250 MG PO DR TAB
250.0000 mg | DELAYED_RELEASE_TABLET | Freq: Two times a day (BID) | ORAL | Status: DC
  Administered 2022-10-15 – 2022-10-18 (×7): 250 mg via ORAL
  Filled 2022-10-15 (×7): qty 1

## 2022-10-15 MED ORDER — DIVALPROEX SODIUM 250 MG PO DR TAB: 250.0000 mg | DELAYED_RELEASE_TABLET | Freq: Every day | ORAL | Status: DC

## 2022-10-15 MED ORDER — PANTOPRAZOLE SODIUM 40 MG PO TBEC
40.0000 mg | DELAYED_RELEASE_TABLET | Freq: Every day | ORAL | Status: DC
  Administered 2022-10-15 – 2022-10-17 (×3): 40 mg via ORAL
  Filled 2022-10-15 (×5): qty 1

## 2022-10-15 MED ORDER — ASPIRIN 81 MG PO TBEC
81.0000 mg | DELAYED_RELEASE_TABLET | Freq: Every day | ORAL | Status: DC
  Administered 2022-10-15 – 2022-10-17 (×3): 81 mg via ORAL
  Filled 2022-10-15 (×4): qty 1

## 2022-10-15 MED ORDER — ZIPRASIDONE MESYLATE 20 MG IM SOLR: 10.0000 mg | Freq: Once | INTRAMUSCULAR | Status: DC

## 2022-10-15 MED ORDER — DONEPEZIL HCL 5 MG PO TABS
5.0000 mg | ORAL_TABLET | Freq: Every day | ORAL | Status: DC
  Administered 2022-10-15 – 2022-10-30 (×11): 5 mg via ORAL
  Filled 2022-10-15 (×14): qty 1
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
## 2022-10-15 NOTE — BH Assessment (Signed)
Representative from [place]) requested respiratory panel results and a BAL for pt.

## 2022-10-15 NOTE — ED Notes (Signed)
Pt had been calm but he is starting to get agitated and attempting to get out of the bed again. Dr. Leonides Schanz notified and new orders received.

## 2022-10-15 NOTE — ED Notes (Signed)
Pt up out of bed and walked into the hall. NT standing with pt. Pt refusing to return to room. Pt not redirectable, pt wanting to walk to [facility] building. Dr. Joni Fears made aware and droperidol ordered. Pt refusing to allow this RN to give meds through IV. Security called to help return pt to bed to give meds. Pt sat on bed and took medicine, recliner placed in room and pt currently sitting in chair.

## 2022-10-15 NOTE — ED Notes (Signed)
Pt up walking around, no obvious distress noted, oriented to person only which is baseline.

## 2022-10-15 NOTE — ED Notes (Signed)
Per NT sitting with pt, pt ate about 10% of breakfast and only a couple bits from lunch. When asked pt states he is not hungry and doesn't want anything to drink currently. Will cont to monitor and offer fluids.

## 2022-10-15 NOTE — ED Notes (Signed)
Family came and collected pt 2 bags of belongings and took him home.

## 2022-10-15 NOTE — ED Notes (Signed)
Daughter called for update

## 2022-10-15 NOTE — ED Notes (Signed)
Sitter informed this RN, pt is becoming a bit restless and trying to get out of bed.

## 2022-10-15 NOTE — ED Provider Notes (Signed)
Emergency Medicine Observation Re-evaluation Note  Tim Barrett is a 82 y.o. male, seen on rounds today.  Pt initially presented to the ED for complaints of Mental Health Problem  Currently, the patient is is no acute distress. Denies any concerns at this time.  Physical Exam  Blood pressure (!) 127/55, pulse 76, temperature 98.1 F (36.7 C), temperature source Oral, resp. rate 17, SpO2 100 %.  Physical Exam: General: No apparent distress Pulm: Normal WOB Neuro: Moving all extremities Psych: Resting comfortably     ED Course / MDM   Clinical Course as of 10/15/22 0610  Fri Oct 14, 2022  1754 X-ray my review and interpretation does not show any evidence of pneumothorax.  Given the location however will proceed with CT imaging.  He also was found to have a puncture wound to the medial proximal right thigh.  No pulsatile bleeding.  Small golf ball sized hematoma.  No distal hard findings to suggest arterial injury.  Will order CT imaging.  Also has some superficial wounds to the left volar extremity.  Neurovascular intact distally. [PR]  1904 CT chest on my review of interpretation does not show any evidence of pneumothorax.  It seems to be isolated anterior chest wall. [PR]  2003 Patient remains hemodynamically stable.  Lacerations repaired as above.  At this point patient is medically cleared for psychiatric evaluation. [PR]  2102 Patient becoming increasingly agitated has received IM Haldol as well as Versed.  May require additional calming medication. [PR]    Clinical Course User Index [PR] Merlyn Lot, MD    I have reviewed the labs performed to date as well as medications administered while in observation.  Recent changes in the last 24 hours include: No acute events overnight.  Plan   Current plan: Patient awaiting psychiatric disposition. Patient is under full IVC at this time.    Shanan Mcmiller, Delice Bison, DO 10/15/22 [phone]

## 2022-10-15 NOTE — ED Notes (Signed)
IVC/Rec. Inpt Admit

## 2022-10-15 NOTE — ED Notes (Signed)
Pt has been calm and cooperative, pt has been sleeping for about 45 mins now

## 2022-10-15 NOTE — BH Assessment (Signed)
Referral checks to;    [facility] ([phone]- [phone]), no beds   [facility] [phone]), Left a HIPAA compliant message requesting a return phone call.   [place] [phone]), unable to reach anyone.   [facility] [phone] -[phone]), declined, dementia is [place] ([phone]), declined, too acute for their unit.   [facility] ([phone]), No beds  [facility] [phone] or [phone]), Left a HIPAA compliant message requesting a return phone call.

## 2022-10-15 NOTE — ED Notes (Signed)
Daughter called for update and stated the family would come by to pick up belongings.

## 2022-10-15 NOTE — ED Notes (Signed)
Patient is IVC pending inpatient admit

## 2022-10-15 NOTE — ED Notes (Signed)
Pt attempting 2x in last hour to get out of bed, pt is easily redirectable at this time and was repositioned in bed.

## 2022-10-15 NOTE — ED Notes (Signed)
Pt attempting to get out of bed again, pt redirected back into bed. Scrub shirt changed due to spilling some water.

## 2022-10-15 NOTE — Consult Note (Signed)
[name] Consult   Reason for Consult:  Suicidal behavior with attempted self-injury  Referring Physician:  Merlyn Lot, MD Patient Identification: [name] MRN:  [id number] Principal Diagnosis: Suicidal behavior with attempted self-injury [facility]) Diagnosis:  Principal Problem:   Suicidal behavior with attempted self-injury [facility]) Active Problems:   Dementia with agitation ([place])   Total Time spent with patient: 45 minutes  Subjective:   Tim Barrett is a 82 y.o. male patient admitted with suicidal behavior with attempted self-injury.  HPI: Tim Barrett (PT) is a male patient with hx of dementia who was admitted for suicidal behavior, causing self-injury with scissors requiring 3 stiches. Per notes by Arta Silence, DO, he received IM Haldol and Versed yesterday for increasing agitation. Upon assessment today, PT is sleeping in room with sitter at bedside. Staff reports PT more cooperative today but was becoming increasingly restless. PT unable to arouse for assessment d/t receiving medication for agitation this AM. PT to be followed up with when able to interact for follow-up psych eval.   Past Psychiatric History: Dementia  Risk to Self:  yes Risk to Others:  none Prior Inpatient Therapy:  none Prior Outpatient Therapy:  unknown  Past Medical History:  Past Medical History:  Diagnosis Date   Acid reflux    BPH (benign prostatic hyperplasia)     Past Surgical History:  Procedure Laterality Date   HERNIA REPAIR     x4   LAMINECTOMY  1989   Family History:  Family History  Problem Relation Age of Onset   Heart attack Father    Prostate cancer Neg Hx    Hematuria Neg Hx    Kidney cancer Neg Hx    Bladder Cancer Neg Hx    Family Psychiatric  History: none Social History:  Social History   Substance and Sexual Activity  Alcohol Use No     Social History   Substance and Sexual Activity  Drug Use No    Social History   Socioeconomic  History   Marital status: Married    Spouse name: Not on file   Number of children: Not on file   Years of education: Not on file   Highest education level: Not on file  Occupational History   Not on file  Tobacco Use   Smoking status: Former    Types: Cigarettes    Quit date: 05/18/2001    Years since quitting: 21.4   Smokeless tobacco: Never  Substance and Sexual Activity   Alcohol use: No   Drug use: No   Sexual activity: Not on file  Other Topics Concern   Not on file  Social History Narrative   Not on file   Social Determinants of Health   Financial Resource Strain: Not on file  Food Insecurity: Not on file  Transportation Needs: Not on file  Physical Activity: Not on file  Stress: Not on file  Social Connections: Not on file   Additional Social History:    Allergies:  No Known Allergies  Labs:  Results for orders placed or performed during the hospital encounter of 10/14/22 (from the past 48 hour(s))  Comprehensive metabolic panel     Status: Abnormal   Collection Time: 10/14/22  4:45 PM  Result Value Ref Range   Sodium 137 135 - 145 mmol/L   Potassium 3.4 (L) 3.5 - 5.1 mmol/L   Chloride 103 98 - 111 mmol/L   CO2 22 22 - 32 mmol/L   Glucose, Bld  112 (H) 70 - 99 mg/dL    Comment: Glucose reference range applies only to samples taken after fasting for at least 8 hours.   BUN 10 8 - 23 mg/dL   Creatinine, Ser 0.92 0.61 - 1.24 mg/dL   Calcium 9.2 8.9 - 10.3 mg/dL   Total Protein 7.0 6.5 - 8.1 g/dL   Albumin 4.1 3.5 - 5.0 g/dL   AST 25 15 - 41 U/L   ALT 15 0 - 44 U/L   Alkaline Phosphatase 60 38 - 126 U/L   Total Bilirubin 1.6 (H) 0.3 - 1.2 mg/dL   GFR, Estimated >60 >60 mL/min    Comment: (NOTE) Calculated using the CKD-EPI Creatinine Equation (2021)    Anion gap 12 5 - 15    Comment: Performed at [facility], [address] [place], [place] [address]  Ethanol     Status: None   Collection Time: 10/14/22  4:45 PM  Result Value Ref Range    Alcohol, Ethyl (B) <10 <10 mg/dL    Comment: (NOTE) Lowest detectable limit for serum alcohol is 10 mg/dL.  For medical purposes only. Performed at [facility], [address] [place], [place] [id number]   Salicylate level     Status: Abnormal   Collection Time: 10/14/22  4:45 PM  Result Value Ref Range   Salicylate Lvl Q000111Q (L) 7.0 - 30.0 mg/dL    Comment: Performed at [facility], [address] [place], [place] [address]  Acetaminophen level     Status: Abnormal   Collection Time: 10/14/22  4:45 PM  Result Value Ref Range   Acetaminophen (Tylenol), Serum <10 (L) 10 - 30 ug/mL    Comment: (NOTE) Therapeutic concentrations vary significantly. A range of 10-30 ug/mL  may be an effective concentration for many patients. However, some  are best treated at concentrations outside of this range. Acetaminophen concentrations >150 ug/mL at 4 hours after ingestion  and >50 ug/mL at 12 hours after ingestion are often associated with  toxic reactions.  Performed at [facility], [address] [place], [place] [address]   cbc     Status: Abnormal   Collection Time: 10/14/22  4:45 PM  Result Value Ref Range   WBC 7.8 4.0 - 10.5 K/uL   RBC 4.21 (L) 4.22 - 5.81 MIL/uL   Hemoglobin 13.3 13.0 - 17.0 g/dL   HCT 38.9 (L) 39.0 - 52.0 %   MCV 92.4 80.0 - 100.0 fL   MCH 31.6 26.0 - 34.0 pg   MCHC 34.2 30.0 - 36.0 g/dL   RDW 12.7 11.5 - 15.5 %   Platelets 193 150 - 400 K/uL   nRBC 0.0 0.0 - 0.2 %    Comment: Performed at [facility], [address] [place], [place] [address]  Valproic acid level     Status: Abnormal   Collection Time: 10/14/22  4:45 PM  Result Value Ref Range   Valproic Acid Lvl 23 (L) 50.0 - 100.0 ug/mL    Comment: Performed at [facility], [address] [place], [place] [address]  Urine Drug Screen, Qualitative     Status: None   Collection Time: 10/14/22  6:24 PM  Result Value Ref Range   Tricyclic, Ur Screen  NONE DETECTED NONE DETECTED   Amphetamines, Ur Screen NONE DETECTED NONE DETECTED   MDMA (Ecstasy)Ur Screen NONE DETECTED NONE DETECTED   Cocaine Metabolite,Ur [place] NONE DETECTED NONE DETECTED   Opiate, Ur Screen NONE DETECTED  NONE DETECTED   Phencyclidine (PCP) Ur S NONE DETECTED NONE DETECTED   Cannabinoid 50 Ng, Ur [place] NONE DETECTED NONE DETECTED   Barbiturates, Ur Screen NONE DETECTED NONE DETECTED   Benzodiazepine, Ur Scrn NONE DETECTED NONE DETECTED   Methadone Scn, Ur NONE DETECTED NONE DETECTED    Comment: (NOTE) Tricyclics + metabolites, urine    Cutoff 1000 ng/mL Amphetamines + metabolites, urine  Cutoff 1000 ng/mL MDMA (Ecstasy), urine              Cutoff 500 ng/mL Cocaine Metabolite, urine          Cutoff 300 ng/mL Opiate + metabolites, urine        Cutoff 300 ng/mL Phencyclidine (PCP), urine         Cutoff 25 ng/mL Cannabinoid, urine                 Cutoff 50 ng/mL Barbiturates + metabolites, urine  Cutoff 200 ng/mL Benzodiazepine, urine              Cutoff 200 ng/mL Methadone, urine                   Cutoff 300 ng/mL  The urine drug screen provides only a preliminary, unconfirmed analytical test result and should not be used for non-medical purposes. Clinical consideration and professional judgment should be applied to any positive drug screen result due to possible interfering substances. A more specific alternate chemical method must be used in order to obtain a confirmed analytical result. Gas chromatography / mass spectrometry (GC/MS) is the preferred confirm atory method. Performed at [facility], [address] [place], [place] [address]     No current facility-administered medications for this encounter.   Current Outpatient Medications  Medication Sig Dispense Refill   alfuzosin (UROXATRAL) 10 MG 24 hr tablet Take 1 tablet (10 mg total) by mouth daily with breakfast. 30 tablet 11   aspirin EC 81 MG tablet Take 81 mg by mouth daily.      divalproex (DEPAKOTE) 250 MG DR tablet Take 250 mg by mouth at bedtime.     donepezil (ARICEPT) 5 MG tablet Take 1 tablet by mouth at bedtime.     esomeprazole (NEXIUM) 20 MG capsule Take by mouth.      Musculoskeletal: Strength & Muscle Tone: UTA    Gait & Station: UTA Patient leans: UTA  Psychiatric Specialty Exam: Physical Exam Vitals and nursing note reviewed.  Constitutional:      Appearance: Normal appearance.  HENT:     Head: Normocephalic.     Nose: Nose normal.  Pulmonary:     Effort: Pulmonary effort is normal.  Psychiatric:        Mood and Affect: Mood is anxious. Affect is blunt.        Cognition and Memory: Cognition is impaired. Memory is impaired.        Judgment: Judgment is impulsive and inappropriate.     Review of Systems  Psychiatric/Behavioral:  Positive for memory loss. The patient is nervous/anxious.     Blood pressure 115/72, pulse 85, temperature 97.8 F (36.6 C), temperature source Oral, resp. rate 17, SpO2 100 %.There is no height or weight on file to calculate BMI.  General Appearance: Fairly Groomed  Eye Contact:  eyes closed  Speech:  UTA  Volume:  UTA  Mood:  anxious earlier  Affect:  blunt  Thought Process:  UTA  Orientation:  UTA  Thought Content:  UTA  Suicidal Thoughts:  UTA  Homicidal Thoughts:  UTA  Memory:  UTA  Judgement:  impaired  Insight:  UTA  Psychomotor Activity:  decreased, increased earlier  Concentration:  UTA  Recall:  [employer] of Knowledge:  UTA  Language:  UTA  Akathisia:  UTA  Handed:  UTA  AIMS (if indicated):     Assets:  Housing Resilience Social Support  ADL's:  Impaired  Cognition:  UTA  Sleep:   UTA    Physical Exam: Physical Exam Vitals and nursing note reviewed.  Constitutional:      Appearance: Normal appearance.  HENT:     Head: Normocephalic.     Nose: Nose normal.  Pulmonary:     Effort: Pulmonary effort is normal.  Psychiatric:        Mood and Affect: Mood is anxious. Affect is  blunt.        Cognition and Memory: Cognition is impaired. Memory is impaired.        Judgment: Judgment is impulsive and inappropriate.    Review of Systems  Psychiatric/Behavioral:  Positive for memory loss. The patient is nervous/anxious.    Blood pressure 115/72, pulse 85, temperature 97.8 F (36.6 C), temperature source Oral, resp. rate 17, SpO2 100 %. There is no height or weight on file to calculate BMI.  Treatment Plan Summary: Daily contact with patient to assess and evaluate symptoms and progress in treatment, Medication management, and Plan : Dementia with behavioral disturbance Increased Depakote 250 mg at bedtime to BID Aricept 5 mg daily  Disposition: Recommend psychiatric Inpatient admission when medically cleared. Supportive therapy provided about ongoing stressors.  Waylan Boga, NP 10/15/2022 11:35 AM

## 2022-10-15 NOTE — ED Notes (Signed)
Sitter at bedside still, pt not actively trying to get up at this time.

## 2022-10-15 NOTE — ED Notes (Signed)
Pt awake and calm, pt was pulled up in bed and repositioned.

## 2022-10-15 NOTE — BH Assessment (Signed)
Per [facility] ([place]), patient to be referred out of system.  Referral information for Psychiatric Hospitalization faxed to;   [facility] [phone]),   [facility] [phone]),  [address] [phone]),   [facility] [phone] -or- [phone]),   [facility] [phone])  [facility] ([phone])

## 2022-10-15 NOTE — ED Notes (Signed)
Pt currently sleeping, NAD at this time

## 2022-10-15 NOTE — ED Notes (Signed)
Pt seems a little agitated about being here in the ER and is wanting to leave. Pt told he is getting transferred to another facility and we are waiting to hear where he is going. Pt is pacing in room but otherwise calm. Per the sitter, pt is combing through items/drawers in room, but has been directable so far. Geodon held at this time.

## 2022-10-16 MED ORDER — RISPERIDONE 1 MG PO TABS
1.0000 mg | ORAL_TABLET | Freq: Two times a day (BID) | ORAL | Status: DC | PRN
  Administered 2022-10-17 – 2022-10-29 (×5): 1 mg via ORAL
  Filled 2022-10-16 (×12): qty 1

## 2022-10-16 NOTE — ED Notes (Signed)
Pt given breakfast tray, Pt refused to eat any of his food. Pt states that he doesn't want any.

## 2022-10-16 NOTE — ED Notes (Signed)
Pt given a lunch tray. 

## 2022-10-16 NOTE — BH Assessment (Signed)
Psych NP and TTS spoke with patient's family (daughter and wife), updated them about the patient disposition of no longer meeting criteria for psych inpatient treatment. The family expressed their concern about not being able to safely manage him at home, as well their most recent barriers with getting him placed into a facility. The recent events has caused the facilities to view him as a "geriatric psych" patient and are declining him. TTS and NP explained to them at length why he no longer meets inpatient criteria and his care is transferring to [facility]. Also explained several times, the patient may be discharged to their care and or remain in the ER until placed but was unable to tell them what that was going to be. Towards the end of the conversation, TTS shared again, he is unsure if they patient will discharge back their care or remain in the ER until placement. Family replied they are unable to take him back home. TTS shared again the patient may discharge to their care or remain in the ER, but someone from the [facility] will speak with them about it and will be able to give more direction.  Melissa Cell Phone ([phone]).

## 2022-10-16 NOTE — ED Notes (Signed)
Pt's daughter called to get update on father. Informed pt's daughter that he has successfully taken all of his medications today, and had some requested ice cream earlier today, which he finished. Also informed daughter that he hasn't had any episodes of aggression, and has had a pretty good overall day so far.

## 2022-10-16 NOTE — ED Notes (Signed)
IVC/pending psych inpatient admission when medically cleared

## 2022-10-16 NOTE — ED Notes (Signed)
Pt currently standing outside of room door. Pt cooperative and easily redirected back to room by sitter.

## 2022-10-16 NOTE — ED Notes (Signed)
Pt currently standing up and walking around room. Pt is calm and cooperative at this time. Safety sitter in room with pt.

## 2022-10-16 NOTE — ED Provider Notes (Signed)
Emergency Medicine Observation Re-evaluation Note  Tim Barrett is a 82 y.o. male, seen on rounds today.  Pt initially presented to the ED for complaints of Mental Health Problem Currently, the patient is resting.  Physical Exam  BP 114/81 (BP Location: Right Arm)   Pulse 86   Temp 97.9 F (36.6 C) (Oral)   Resp 18   SpO2 96%  Physical Exam Gen:  No acute distress Resp:  Breathing easily and comfortably, no accessory muscle usage Neuro:  Moving all four extremities, no gross focal neuro deficits Psych:  Resting currently, calm when awake  ED Course / MDM  EKG:   I have reviewed the labs performed to date as well as medications administered while in observation.  Recent changes in the last 24 hours include no significant changes.  Plan  Current plan is for psych placement.  Patient is under involuntary commitment.z    Hinda Kehr, MD 10/16/22 [phone]

## 2022-10-16 NOTE — ED Notes (Signed)
Pt requesting ice cream, cup of ice cream given to pt.

## 2022-10-16 NOTE — Consult Note (Signed)
[facility] Consult   Reason for Consult:  Suicidal behavior with attempted self-injury  Referring Physician:  Merlyn Lot, MD Patient Identification: Tim Barrett MRN:  [id number] Principal Diagnosis: Suicidal behavior with attempted self-injury [facility]) Diagnosis:  Principal Problem:   Suicidal behavior with attempted self-injury [facility]) Active Problems:   Dementia with agitation ([place])   Total Time spent with patient: 45 minutes  Subjective:   Tim Barrett is a 82 y.o. male patient admitted with suicidal behavior with self-injury.  He stated he is here to "learn."  Today, he is sitting up in a recliner, starring into space.  He answered with on one word sentences, no suicidal ideations or any self-harm behaviors, reports he slept well, appetite without issues.  His family was contacted to let them know a geropsych bed was not found related to exclusionary criteria of dementia.  He is not having any threatening behaviors or aggression in the ED, psych cleared and TOC order placed.  The daughter and wife refuse for him to return home based on safety concerns.  HPI: Tim Barrett (Tim Barrett) is a male patient with hx of dementia who was admitted for suicidal behavior, causing self-injury with scissors requiring 3 stiches. Per notes by Arta Silence, DO, he received IM Haldol and Versed yesterday for increasing agitation. Upon assessment today, Tim Barrett is sleeping in room with sitter at bedside. Staff reports Tim Barrett more cooperative today but was becoming increasingly restless. Tim Barrett unable to arouse for assessment d/t receiving medication for agitation this AM. Tim Barrett to be followed up with when able to interact for follow-up psych eval.   Past Psychiatric History: Dementia  Risk to Self:  none Risk to Others:  none Prior Inpatient Therapy:  none Prior Outpatient Therapy:  unknown  Past Medical History:  Past Medical History:  Diagnosis Date   Acid reflux    BPH (benign prostatic hyperplasia)      Past Surgical History:  Procedure Laterality Date   HERNIA REPAIR     x4   LAMINECTOMY  1989   Family History:  Family History  Problem Relation Age of Onset   Heart attack Father    Prostate cancer Neg Hx    Hematuria Neg Hx    Kidney cancer Neg Hx    Bladder Cancer Neg Hx    Family Psychiatric  History: none Social History:  Social History   Substance and Sexual Activity  Alcohol Use No     Social History   Substance and Sexual Activity  Drug Use No    Social History   Socioeconomic History   Marital status: Married    Spouse name: Not on file   Number of children: Not on file   Years of education: Not on file   Highest education level: Not on file  Occupational History   Not on file  Tobacco Use   Smoking status: Former    Types: Cigarettes    Quit date: 05/18/2001    Years since quitting: 21.4   Smokeless tobacco: Never  Substance and Sexual Activity   Alcohol use: No   Drug use: No   Sexual activity: Not on file  Other Topics Concern   Not on file  Social History Narrative   Not on file   Social Determinants of Health   Financial Resource Strain: Not on file  Food Insecurity: Not on file  Transportation Needs: Not on file  Physical Activity: Not on file  Stress: Not on file  Social Connections:  Not on file   Additional Social History:    Allergies:  No Known Allergies  Labs:  Results for orders placed or performed during the hospital encounter of 10/14/22 (from the past 48 hour(s))  Comprehensive metabolic panel     Status: Abnormal   Collection Time: 10/14/22  4:45 PM  Result Value Ref Range   Sodium 137 135 - 145 mmol/L   Potassium 3.4 (L) 3.5 - 5.1 mmol/L   Chloride 103 98 - 111 mmol/L   CO2 22 22 - 32 mmol/L   Glucose, Bld 112 (H) 70 - 99 mg/dL    Comment: Glucose reference range applies only to samples taken after fasting for at least 8 hours.   BUN 10 8 - 23 mg/dL   Creatinine, Ser 0.92 0.61 - 1.24 mg/dL   Calcium 9.2 8.9 -  10.3 mg/dL   Total Protein 7.0 6.5 - 8.1 g/dL   Albumin 4.1 3.5 - 5.0 g/dL   AST 25 15 - 41 U/L   ALT 15 0 - 44 U/L   Alkaline Phosphatase 60 38 - 126 U/L   Total Bilirubin 1.6 (H) 0.3 - 1.2 mg/dL   GFR, Estimated >60 >60 mL/min    Comment: (NOTE) Calculated using the CKD-EPI Creatinine Equation (2021)    Anion gap 12 5 - 15    Comment: Performed at [facility], [address] [place], [place] [address]  Ethanol     Status: None   Collection Time: 10/14/22  4:45 PM  Result Value Ref Range   Alcohol, Ethyl (B) <10 <10 mg/dL    Comment: (NOTE) Lowest detectable limit for serum alcohol is 10 mg/dL.  For medical purposes only. Performed at [facility], [address] [place], [place] [id number]   Salicylate level     Status: Abnormal   Collection Time: 10/14/22  4:45 PM  Result Value Ref Range   Salicylate Lvl Q000111Q (L) 7.0 - 30.0 mg/dL    Comment: Performed at [facility], [address] [place], [place] [address]  Acetaminophen level     Status: Abnormal   Collection Time: 10/14/22  4:45 PM  Result Value Ref Range   Acetaminophen (Tylenol), Serum <10 (L) 10 - 30 ug/mL    Comment: (NOTE) Therapeutic concentrations vary significantly. A range of 10-30 ug/mL  may be an effective concentration for many patients. However, some  are best treated at concentrations outside of this range. Acetaminophen concentrations >150 ug/mL at 4 hours after ingestion  and >50 ug/mL at 12 hours after ingestion are often associated with  toxic reactions.  Performed at [facility], [address] [place], [place] [address]   cbc     Status: Abnormal   Collection Time: 10/14/22  4:45 PM  Result Value Ref Range   WBC 7.8 4.0 - 10.5 K/uL   RBC 4.21 (L) 4.22 - 5.81 MIL/uL   Hemoglobin 13.3 13.0 - 17.0 g/dL   HCT 38.9 (L) 39.0 - 52.0 %   MCV 92.4 80.0 - 100.0 fL   MCH 31.6 26.0 - 34.0 pg   MCHC 34.2 30.0 - 36.0 g/dL   RDW 12.7 11.5 - 15.5 %    Platelets 193 150 - 400 K/uL   nRBC 0.0 0.0 - 0.2 %    Comment: Performed at [facility], [address] [place], [place] [address]  Valproic acid level     Status: Abnormal   Collection Time: 10/14/22  4:45 PM  Result Value  Ref Range   Valproic Acid Lvl 23 (L) 50.0 - 100.0 ug/mL    Comment: Performed at [facility], [address] [place], [place] [address]  Urine Drug Screen, Qualitative     Status: None   Collection Time: 10/14/22  6:24 PM  Result Value Ref Range   Tricyclic, Ur Screen NONE DETECTED NONE DETECTED   Amphetamines, Ur Screen NONE DETECTED NONE DETECTED   MDMA (Ecstasy)Ur Screen NONE DETECTED NONE DETECTED   Cocaine Metabolite,Ur [place] NONE DETECTED NONE DETECTED   Opiate, Ur Screen NONE DETECTED NONE DETECTED   Phencyclidine (PCP) Ur S NONE DETECTED NONE DETECTED   Cannabinoid 50 Ng, Ur [place] NONE DETECTED NONE DETECTED   Barbiturates, Ur Screen NONE DETECTED NONE DETECTED   Benzodiazepine, Ur Scrn NONE DETECTED NONE DETECTED   Methadone Scn, Ur NONE DETECTED NONE DETECTED    Comment: (NOTE) Tricyclics + metabolites, urine    Cutoff 1000 ng/mL Amphetamines + metabolites, urine  Cutoff 1000 ng/mL MDMA (Ecstasy), urine              Cutoff 500 ng/mL Cocaine Metabolite, urine          Cutoff 300 ng/mL Opiate + metabolites, urine        Cutoff 300 ng/mL Phencyclidine (PCP), urine         Cutoff 25 ng/mL Cannabinoid, urine                 Cutoff 50 ng/mL Barbiturates + metabolites, urine  Cutoff 200 ng/mL Benzodiazepine, urine              Cutoff 200 ng/mL Methadone, urine                   Cutoff 300 ng/mL  The urine drug screen provides only a preliminary, unconfirmed analytical test result and should not be used for non-medical purposes. Clinical consideration and professional judgment should be applied to any positive drug screen result due to possible interfering substances. A more specific alternate chemical method must be used in order to  obtain a confirmed analytical result. Gas chromatography / mass spectrometry (GC/MS) is the preferred confirm atory method. Performed at [facility], [address] [place], [place] [address]     Current Facility-Administered Medications  Medication Dose Route Frequency Provider Last Rate Last Admin   alfuzosin (UROXATRAL) 24 hr tablet 10 mg  10 mg Oral Q breakfast Patrecia Pour, NP   10 mg at 10/16/22 0805   aspirin EC tablet 81 mg  81 mg Oral Daily Patrecia Pour, NP   81 mg at 10/16/22 1009   divalproex (DEPAKOTE) DR tablet 250 mg  250 mg Oral Q12H Patrecia Pour, NP   250 mg at 10/16/22 1009   donepezil (ARICEPT) tablet 5 mg  5 mg Oral QHS Patrecia Pour, NP   5 mg at 10/15/22 2229   pantoprazole (PROTONIX) EC tablet 40 mg  40 mg Oral Daily Patrecia Pour, NP   40 mg at 10/16/22 1009   ziprasidone (GEODON) injection 10 mg  10 mg Intramuscular Once Lucillie Garfinkel, MD       Current Outpatient Medications  Medication Sig Dispense Refill   aspirin EC 81 MG tablet Take 81 mg by mouth daily.     divalproex (DEPAKOTE) 250 MG DR tablet Take 250 mg by mouth at bedtime.     donepezil (ARICEPT) 5 MG tablet Take 1 tablet by mouth at bedtime.     esomeprazole (  NEXIUM) 20 MG capsule Take by mouth.     alfuzosin (UROXATRAL) 10 MG 24 hr tablet Take 1 tablet (10 mg total) by mouth daily with breakfast. (Patient not taking: Reported on 10/15/2022) 30 tablet 11    Musculoskeletal: Strength & Muscle Tone: UTA    Gait & Station: UTA Patient leans: UTA  Psychiatric Specialty Exam: Physical Exam Vitals and nursing note reviewed.  Constitutional:      Appearance: Normal appearance.  HENT:     Head: Normocephalic.     Nose: Nose normal.  Pulmonary:     Effort: Pulmonary effort is normal.  Psychiatric:        Mood and Affect: Mood is anxious. Affect is blunt.        Cognition and Memory: Cognition is impaired. Memory is impaired.        Judgment: Judgment is impulsive and  inappropriate.     Review of Systems  Psychiatric/Behavioral:  Positive for memory loss.   All other systems reviewed and are negative.   Blood pressure 132/89, pulse 97, temperature 97.9 F (36.6 C), temperature source Oral, resp. rate 18, SpO2 100 %.There is no height or weight on file to calculate BMI.  General Appearance: Fairly Groomed  Eye Contact:  fair  Speech:  WDL  Volume:  WDL  Mood:  euthymic  Affect:  blunt  Thought Process:  appeared logical, one word answers  Orientation:  alert and oriented to person  Thought Content:  WDL  Suicidal Thoughts:  None  Homicidal Thoughts:  None  Memory:  poor  Judgement:  impaired  Insight:  lacking  Psychomotor Activity:  decreased  Concentration:  fair  Recall:  fair  Fund of Knowledge:  fair  Language:  minimal  Akathisia:  none  Handed:  right  AIMS (if indicated):     Assets:  Housing Resilience Social Support  ADL's:  Impaired  Cognition:  moderate to severe  Sleep:   good    Physical Exam: Physical Exam Vitals and nursing note reviewed.  Constitutional:      Appearance: Normal appearance.  HENT:     Head: Normocephalic.     Nose: Nose normal.  Pulmonary:     Effort: Pulmonary effort is normal.  Psychiatric:        Mood and Affect: Mood is anxious. Affect is blunt.        Cognition and Memory: Cognition is impaired. Memory is impaired.        Judgment: Judgment is impulsive and inappropriate.    Review of Systems  Psychiatric/Behavioral:  Positive for memory loss.   All other systems reviewed and are negative.  Blood pressure 132/89, pulse 97, temperature 97.9 F (36.6 C), temperature source Oral, resp. rate 18, SpO2 100 %. There is no height or weight on file to calculate BMI.  Treatment Plan Summary: Daily contact with patient to assess and evaluate symptoms and progress in treatment, Medication management, and Plan : Dementia with behavioral disturbance Depakote 250 mg BID Risperdal 1 mg BID PRN  agitation Aricept 5 mg daily  Disposition: Psych cleared, TOC consult for SNF placement  Waylan Boga, NP 10/16/2022 10:46 AM

## 2022-10-16 NOTE — ED Notes (Signed)
Pt states that he likes orange juice. Pt given orange juice and successfully took his medications with this RN

## 2022-10-16 NOTE — ED Notes (Signed)
Pt given warm blanket, and currently sitting in recliner chair.

## 2022-10-17 MED ORDER — DROPERIDOL 2.5 MG/ML IJ SOLN
2.5000 mg | Freq: Once | INTRAMUSCULAR | Status: AC
  Administered 2022-10-17: 2.5 mg via INTRAVENOUS
  Filled 2022-10-17: qty 2

## 2022-10-17 NOTE — ED Notes (Signed)
IVC papers  rescinded  per  Barbaraann Share  NP  informed  Alwyn Pea

## 2022-10-17 NOTE — Consult Note (Signed)
1500: Patient seen face-to face. Per bedside RN, patient has been vomiting, says he is unable to swallow. RN has informed EDP and perhaps patient will have a swallow study.  On approach, patient is laying on bed, and is alert. He is disoriented to time, place, circumstances.He makes good eye contact, is pleasant, and smiles at this provider. He tells me "I'm doing fine." When I asked him again why he thinks he is here in the hospital he says he does not know. He does not remember that he tried to hurt himself. Patient states that he does not have any thought of harming himself or killing himself.  Patient has dementia and according to nursing staff he waxes and wanes in his expressions of thought.     Patient had some medication adjustments by Waylan Boga ,NP yesterday and a TOC consult was placed for memory care placement, which family has been trying to obtain. We will continue with medication management. TOC is working with patient and family. IVC was rescinded.   Sherlon Handing, PMHNP

## 2022-10-17 NOTE — ED Notes (Signed)
ivc/psych cleared/toc consult for snf placement.Mar[place] Kitchen

## 2022-10-17 NOTE — ED Provider Notes (Signed)
Emergency Medicine Observation Re-evaluation Note  Tim Barrett is a 82 y.o. male, seen on rounds today.  Pt initially presented to the ED for complaints of Mental Health Problem  Currently, the patient is is no acute distress. Denies any concerns at this time.  Physical Exam  Blood pressure 137/79, pulse 91, temperature 97.7 F (36.5 C), temperature source Oral, resp. rate 17, SpO2 97 %.  Physical Exam: General: No apparent distress Pulm: Normal WOB Neuro: Moving all extremities Psych: Resting comfortably     ED Course / MDM   Clinical Course as of 10/17/22 0437  Fri Oct 14, 2022  1754 X-ray my review and interpretation does not show any evidence of pneumothorax.  Given the location however will proceed with CT imaging.  He also was found to have a puncture wound to the medial proximal right thigh.  No pulsatile bleeding.  Small golf ball sized hematoma.  No distal hard findings to suggest arterial injury.  Will order CT imaging.  Also has some superficial wounds to the left volar extremity.  Neurovascular intact distally. [PR]  1904 CT chest on my review of interpretation does not show any evidence of pneumothorax.  It seems to be isolated anterior chest wall. [PR]  2003 Patient remains hemodynamically stable.  Lacerations repaired as above.  At this point patient is medically cleared for psychiatric evaluation. [PR]  2102 Patient becoming increasingly agitated has received IM Haldol as well as Versed.  May require additional calming medication. [PR]    Clinical Course User Index [PR] Merlyn Lot, MD    I have reviewed the labs performed to date as well as medications administered while in observation.  Recent changes in the last 24 hours include: No acute events overnight.  Plan   Current plan: Patient awaiting psychiatric disposition. Patient is under full IVC at this time.    Tim Barrett, Delice Bison, DO 10/17/22 [phone]

## 2022-10-17 NOTE — ED Notes (Signed)
Assumed care of pt.  VSS updated and pt resting in recliner with sitter.  Sitter informed this RN that the pt is not eating and only taking a few sips of water. MD notified.

## 2022-10-17 NOTE — ED Notes (Signed)
Administered pts night meds with apple sauce and a juice to drink. Pt multiple times pulls out pill from mouth. Pt receives multiple education on taking med. Pt eventually swallows to best of this nurses judgement after chewing and drinking drink. Did not visualize in pts mouth. Sitter will alert nurse should pt spit it out

## 2022-10-17 NOTE — TOC Initial Note (Signed)
Transition of Care [facility]) - Initial/Assessment Note    Patient Details  Name: Tim Barrett MRN: [id number] Date of Birth: 1941-01-13  Transition of Care [facility]) CM/SW Contact:    Tim Bash, LCSW Phone Number: 10/17/2022, 9:30 AM  Clinical Narrative:                  CSW notes patient from home with family, hx of dementia.   Patient is unable to return home with family, they are seeking placement options.   CSW spoke with [name] and spouse Tim Barrett, they report they have toured [facility], they are going to see [employer] today. They are agreeable to referral to [facility] to assist with placement options, referral emailed to [facility] with [facility].   Expected Discharge Plan: Memory Care Barriers to Discharge:  (placement)   Patient Goals and CMS Choice   CMS Medicare.gov Compare Post Acute Care list provided to:: Patient Represenative (must comment) (daughter Tim Barrett) Choice offered to / list presented to : Adult Children      Expected Discharge Plan and Services                                              Prior Living Arrangements/Services                       Activities of Daily Living      Permission Sought/Granted                  Emotional Assessment              Admission diagnosis:  voluntary Patient Active Problem List   Diagnosis Date Noted   Suicidal behavior with attempted self-injury ([place]) 10/15/2022   Dementia with agitation ([place]) 10/15/2022   Hypercholesterolemia 01/20/2014   Peptic ulcer disease 01/20/2014   PCP:  Tim Hire, MD Pharmacy:   [facility] [address] [place] [address] Phone: [phone] Fax: [phone]     Social Determinants of Health (SDOH) Social History: SDOH Screenings   Tobacco Use: Medium Risk (10/14/2022)   SDOH Interventions:     Readmission Risk  Interventions     No data to display

## 2022-10-17 NOTE — ED Notes (Signed)
Family updated by this RN

## 2022-10-17 NOTE — ED Notes (Signed)
Pt given sip of water- tolerated well at this time. Pt noted to have trouble swallowing food earlier. SLP evaluation pending at this time.

## 2022-10-17 NOTE — ED Notes (Signed)
VOL  TOC  PLACEMENT

## 2022-10-17 NOTE — ED Notes (Signed)
Pt dropped protonix on floor, additional dose removed from pixys and given to pt

## 2022-10-17 NOTE — ED Notes (Signed)
Pt attempting to leave facility- continuously attempts to get out of stretcher. Sitter at bedside, but unable to verbally redirect patient. Medications ordered and administered by this RN.

## 2022-10-18 MED ORDER — DIVALPROEX SODIUM 125 MG PO CSDR
250.0000 mg | DELAYED_RELEASE_CAPSULE | Freq: Two times a day (BID) | ORAL | Status: DC
  Administered 2022-10-18 – 2022-10-29 (×17): 250 mg via ORAL
  Filled 2022-10-18 (×22): qty 2

## 2022-10-18 MED ORDER — HALOPERIDOL LACTATE 5 MG/ML IJ SOLN
2.5000 mg | Freq: Once | INTRAMUSCULAR | Status: AC
  Administered 2022-10-18: 2.5 mg via INTRAVENOUS
  Filled 2022-10-18: qty 1

## 2022-10-18 MED ORDER — FAMOTIDINE 20 MG PO TABS
20.0000 mg | ORAL_TABLET | Freq: Every day | ORAL | Status: DC
  Administered 2022-10-19 – 2022-10-22 (×4): 20 mg via ORAL
  Filled 2022-10-18 (×4): qty 1

## 2022-10-18 MED ORDER — ASPIRIN 81 MG PO CHEW
81.0000 mg | CHEWABLE_TABLET | Freq: Every day | ORAL | Status: DC
  Administered 2022-10-19 – 2022-10-28 (×9): 81 mg via ORAL
  Filled 2022-10-18 (×12): qty 1

## 2022-10-18 NOTE — TOC Progression Note (Signed)
Transition of Care [facility]) - Progression Note    Patient Details  Name: ARION BUFKIN MRN: [id number] Date of Birth: 05/28/1941  Transition of Care [facility]) CM/SW [place],  Phone Number: 10/18/2022, 3:16 PM  Clinical Narrative:     Face to face virtual assessment completed with Malachy Mood with [facility] in coordination with [facility] with [facility].   Malachy Mood reports she believes patient will be a good candidate for the family care home, Andee Poles with [facility] to review prices with family and see if they are agreeable.   Expected Discharge Plan: Memory Care Barriers to Discharge:  (placement)  Expected Discharge Plan and Services                                               Social Determinants of Health (SDOH) Interventions SDOH Screenings   Tobacco Use: Medium Risk (10/14/2022)    Readmission Risk Interventions     No data to display

## 2022-10-18 NOTE — TOC Progression Note (Signed)
Transition of Care [facility]) - Progression Note    Patient Details  Name: MARKQUIS BRUCK MRN: [id number] Date of Birth: [birth year]  Transition of Care [facility]) CM/SW [place], [place] Phone Number: 10/18/2022, 11:45 AM  Clinical Narrative:     CSW spoke with patient's daughter [name], she reports continuing to work with [facility] for placement, updated on patient's IVC being rescinded. She does express concerns if they are unable to find placement timely and need to take patient home, she reports they would ideally like to find placement from ED.   CSW spoke with Quillian Quince from [facility], updated on IVC rescinded and no HI/SI. She reports she has some placement options she is reviewing with family this afternoon and will update CSW on how this goes.   Requested fl2 be sent, emailed to her.    Expected Discharge Plan: Memory Care Barriers to Discharge:  (placement)  Expected Discharge Plan and Services                                               Social Determinants of Health (SDOH) Interventions SDOH Screenings   Tobacco Use: Medium Risk (10/14/2022)    Readmission Risk Interventions     No data to display

## 2022-10-18 NOTE — ED Provider Notes (Signed)
-----------------------------------------   7:49 AM on 10/18/2022 -----------------------------------------   Blood pressure 123/88, pulse 91, temperature 98.2 F (36.8 C), temperature source Oral, resp. rate 16, SpO2 98 %.  The patient is calm and cooperative at this time.  There have been no acute events since the last update.  Awaiting disposition plan from [facility] team.   Hinda Kehr, MD 10/18/22 [phone]

## 2022-10-18 NOTE — ED Notes (Signed)
Offered more applesauce and icecream, pt declines.

## 2022-10-18 NOTE — ED Notes (Signed)
Pt brief was soiled, pt was cleaned and new brief put on. Pt is clean and dry at this time.

## 2022-10-18 NOTE — ED Notes (Signed)
Pt was repeatedly attempting to get out of bed and not following commands. Pt shaking side rails and no interventions were working. Pt unsafe due to behavior and was becoming annoyed with reorientation by staff. PRN in place

## 2022-10-18 NOTE — ED Notes (Addendum)
Pt was just cleaned up by this tech and RN, [place]. Pad changed, new brief and new clothes and blankets.  This tech offered pt food from lunch but pt denied wanting anything.

## 2022-10-18 NOTE — ED Notes (Signed)
VOl Toc placement

## 2022-10-18 NOTE — ED Notes (Signed)
Report to Ashley, RN

## 2022-10-18 NOTE — ED Notes (Signed)
Report given to Kim, RN.
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
## 2022-10-18 NOTE — NC FL2 (Addendum)
[place] LEVEL OF CARE FORM     IDENTIFICATION  Patient Name: Tim Barrett Birthdate: [birth year] Sex: male Admission Date (Current Location): 10/14/2022  [place] and [place] Number:  Engineering geologist and Address:  [facility], [address] [place], [place] [address]      Provider Number: [id number]  Attending Physician Name and Address:  No att. providers found  Relative Name and Phone Number:  [name] (spouse)  [phone]    Current Level of Care: Hospital Recommended Level of Care: [facility] Prior Approval Number:    Date Approved/Denied:   PASRR Number: [id number]  Discharge Plan:  [facility]    Current Diagnoses: Patient Active Problem List   Diagnosis Date Noted   Suicidal behavior with attempted self-injury ([place]) 10/15/2022   Dementia with agitation ([place]) 10/15/2022   Hypercholesterolemia 01/20/2014   Peptic ulcer disease 01/20/2014    Orientation RESPIRATION BLADDER Height & Weight     Self  Normal Continent Weight:   Height:     BEHAVIORAL SYMPTOMS/MOOD NEUROLOGICAL BOWEL NUTRITION STATUS      Continent Diet (normal)  AMBULATORY STATUS COMMUNICATION OF NEEDS Skin   Limited Assist Verbally Normal                       Personal Care Assistance Level of Assistance  Bathing, Feeding, Dressing, Total care Bathing Assistance: Limited assistance Feeding assistance: Independent Dressing Assistance: Limited assistance Total Care Assistance: Limited assistance   Functional Limitations Info  Sight, Hearing, Speech Sight Info: Adequate Hearing Info: Adequate Speech Info: Adequate    SPECIAL CARE FACTORS FREQUENCY                       Contractures Contractures Info: Not present    Additional Factors Info  Code Status, Allergies Code Status Info: full Allergies Info: no known allergies           Current Medications (10/18/2022):  This is the current hospital active medication  list Current Facility-Administered Medications  Medication Dose Route Frequency Provider Last Rate Last Admin   alfuzosin (UROXATRAL) 24 hr tablet 10 mg  10 mg Oral Q breakfast Patrecia Pour, NP   10 mg at 10/17/22 [id number]   aspirin chewable tablet 81 mg  81 mg Oral Daily Blake Divine, MD       divalproex (DEPAKOTE SPRINKLE) capsule 250 mg  250 mg Oral Q12H Blake Divine, MD       donepezil (ARICEPT) tablet 5 mg  5 mg Oral QHS Patrecia Pour, NP   5 mg at 10/17/22 2132   famotidine (PEPCID) tablet 20 mg  20 mg Oral Daily Blake Divine, MD       risperiDONE (RISPERDAL) tablet 1 mg  1 mg Oral BID PRN Patrecia Pour, NP   1 mg at 10/18/22 0038   Current Outpatient Medications  Medication Sig Dispense Refill   aspirin EC 81 MG tablet Take 81 mg by mouth daily.     divalproex (DEPAKOTE) 250 MG DR tablet Take 250 mg by mouth at bedtime.     donepezil (ARICEPT) 5 MG tablet Take 1 tablet by mouth at bedtime.     esomeprazole (NEXIUM) 20 MG capsule Take by mouth.     alfuzosin (UROXATRAL) 10 MG 24 hr tablet Take 1 tablet (10 mg total) by mouth daily with breakfast. (Patient not taking: Reported on 10/15/2022) 30 tablet 11     Discharge Medications:  Please see discharge summary for a list of discharge medications.  Relevant Imaging Results:  Relevant Lab Results:   Additional Information [id number]  Tiburcio Bash, LCSW

## 2022-10-18 NOTE — ED Notes (Signed)
Since pt woke up he has continually been attempting to get out of bad by throwing legs over side railing of bed.

## 2022-10-18 NOTE — ED Notes (Addendum)
Pt awake, alert and agitated. Attempting to get out of bed. Refusing anything by mouth including food. EDP Isaacs notified. Unable to redirect verbally or provide any meaningful distraction.

## 2022-10-18 NOTE — ED Notes (Signed)
Full linen change and brief changed

## 2022-10-18 NOTE — ED Notes (Signed)
Refuses brief change at this time.

## 2022-10-18 NOTE — ED Notes (Addendum)
Crissie Reese, EDP notified of patient difficulties swallowing. Pt had official swallow eval. Able to take meds crushed. Unable to crush some of AM meds. Those meds were held, EDP Isaacs notified.

## 2022-10-18 NOTE — ED Notes (Signed)
Pt clean and dry at this time. Pt had no intake for lunch due to refusal.

## 2022-10-19 NOTE — ED Notes (Addendum)
Pt ate 1 bite of pureed sausage, one bite of apples suace and 2 sips of water. Pt experiencing difficulties swallowing despite directions.  EDP Siadecki and speech informed via secure chat.

## 2022-10-19 NOTE — ED Notes (Signed)
Pt took night meds in apple sauce and crushed.

## 2022-10-19 NOTE — ED Notes (Signed)
Pt daughter at the bedside

## 2022-10-19 NOTE — ED Notes (Signed)
vol/toc placement.Marland Kitchen

## 2022-10-19 NOTE — ED Notes (Addendum)
Pt feeding himself Magic cup of frozen treat. Total intake 1/2 of Magic Cup on lunch tray.  Pt clean and dry at this time.

## 2022-10-19 NOTE — TOC Progression Note (Signed)
Transition of Care [facility]) - Progression Note    Patient Details  Name: AVETT CARAZO MRN: [id number] Date of Birth: [birth year]  Transition of Care [facility]) CM/SW [place], [place] Phone Number: 10/19/2022, 3:08 PM  Clinical Narrative:      CSW spoke with patient's daughter [name], she reports they saw [facility] today, they believe patient may can transfer tomorrow per facility.   [name] requests call from MD, MD informed.    Expected Discharge Plan: Memory Care Barriers to Discharge:  (placement)  Expected Discharge Plan and Services                                               Social Determinants of Health (SDOH) Interventions SDOH Screenings   Tobacco Use: Medium Risk (10/14/2022)    Readmission Risk Interventions     No data to display

## 2022-10-19 NOTE — ED Notes (Addendum)
Wife at the bedside to visit pt. Pt offered dinner tray. Refused at this time. Pt smiling and talking to wife and called this nurse over and states he wanted to introduce this nurse to his wife. Pt states he loves his wife very much and would give a million dollars to be able to sit and talk with her. Pt wife tearful and holding pt hand.

## 2022-10-19 NOTE — ED Notes (Signed)
VOL/Pending TOC Placement

## 2022-10-19 NOTE — ED Notes (Signed)
Pt awake and alert at this time. Ally RN attempted to feed pt his breakfast and crushed pills (Alfuzosin HCL not crushed) in applesauce.  Pt coughing when attempting eat meal and appears to have difficulty swallowing. Pt refusing to eat more than one bite of his pureed sausage and his applesauce. Refuses to swallow sips of water or his Alfuzosin.

## 2022-10-19 NOTE — ED Notes (Signed)
Report received by Romie Minus

## 2022-10-19 NOTE — TOC Progression Note (Signed)
Transition of Care [facility]) - Progression Note    Patient Details  Name: Tim Barrett MRN: [id number] Date of Birth: October 05, 1940  Transition of Care [facility]) CM/SW [address], [place] Phone Number: 10/19/2022, 9:34 AM  Clinical Narrative:     Phoebe Perch co signed by MD has been emailed to [facility] with [facility]   Expected Discharge Plan: Memory Care Barriers to Discharge:  (placement)  Expected Discharge Plan and Services                                               Social Determinants of Health (SDOH) Interventions SDOH Screenings   Tobacco Use: Medium Risk (10/14/2022)    Readmission Risk Interventions     No data to display

## 2022-10-19 NOTE — ED Provider Notes (Signed)
-----------------------------------------   6:07 AM on 10/19/2022 -----------------------------------------   Blood pressure 123/88, pulse 91, temperature 98.2 F (36.8 C), temperature source Oral, resp. rate 16, SpO2 98 %.  The patient is calm and cooperative at this time.  There have been no acute events since the last update.  Awaiting disposition plan from [facility] team.   Hinda Kehr, MD 10/19/22 [phone]

## 2022-10-19 NOTE — ED Notes (Signed)
Pt attempting to get out of bed. Pt assisted to restroom by ED nurse and ED tech.  Pt depends and pants were changed. Pt alert and appears cooperative.

## 2022-10-20 NOTE — Discharge Instructions (Addendum)
Your stitches will need to be removed on March 4th.

## 2022-10-20 NOTE — ED Notes (Signed)
Vol /pending TOC placement

## 2022-10-20 NOTE — ED Notes (Signed)
Brief and linens changed. Pt soiled of urine only. Pt resists against being changed. Privacy provided. Pt clean and dry at this time.   Nurse fed patient ice cream and 4 oz of water. Pt refuses anymore food or drink.

## 2022-10-20 NOTE — ED Notes (Signed)
This RN attempted attempted to contact daughter on cell phone- no answer at this time. Will call back later.

## 2022-10-20 NOTE — ED Notes (Signed)
This RN fed patient- pt only ate 1 cup of chocolate ice cream. Pt would not eat the rest of his tray. Pt taking sips of water with ice cream.

## 2022-10-20 NOTE — TOC Progression Note (Signed)
Transition of Care [facility]) - Progression Note    Patient Details  Name: Tim Barrett MRN: [id number] Date of Birth: 12-19-1940  Transition of Care [facility]) CM/SW [place], [place] Phone Number: 10/20/2022, 10:40 AM  Clinical Narrative:     CSW spoke with patient's daughter [name] who reports patient's spouse has decided, after we have identified [facility] can accept patient, they wish to take patient home at this time. She is agreeable to home health services, no preference of agency. Referral given to [facility] with [facility]. She reports no dme needs. MD made aware.    Expected Discharge Plan: Memory Care Barriers to Discharge:  (placement)  Expected Discharge Plan and Services                                               Social Determinants of Health (SDOH) Interventions SDOH Screenings   Tobacco Use: Medium Risk (10/14/2022)    Readmission Risk Interventions     No data to display

## 2022-10-21 ENCOUNTER — Emergency Department: Payer: Medicare PPO

## 2022-10-21 LAB — CBC WITH DIFFERENTIAL/PLATELET
Abs Immature Granulocytes: 0.03 10*3/uL (ref 0.00–0.07)
Basophils Absolute: 0 10*3/uL (ref 0.0–0.1)
Basophils Relative: 0 %
Eosinophils Absolute: 0 10*3/uL (ref 0.0–0.5)
Eosinophils Relative: 0 %
HCT: 40.6 % (ref 39.0–52.0)
Hemoglobin: 13.6 g/dL (ref 13.0–17.0)
Immature Granulocytes: 0 %
Lymphocytes Relative: 11 %
Lymphs Abs: 0.8 10*3/uL (ref 0.7–4.0)
MCH: 31.4 pg (ref 26.0–34.0)
MCHC: 33.5 g/dL (ref 30.0–36.0)
MCV: 93.8 fL (ref 80.0–100.0)
Monocytes Absolute: 0.7 10*3/uL (ref 0.1–1.0)
Monocytes Relative: 10 %
Neutro Abs: 5.8 10*3/uL (ref 1.7–7.7)
Neutrophils Relative %: 79 %
Platelets: 198 10*3/uL (ref 150–400)
RBC: 4.33 MIL/uL (ref 4.22–5.81)
RDW: 12.6 % (ref 11.5–15.5)
WBC: 7.5 10*3/uL (ref 4.0–10.5)
nRBC: 0 % (ref 0.0–0.2)

## 2022-10-21 LAB — RESP PANEL BY RT-PCR (RSV, FLU A&B, COVID)  RVPGX2
Influenza A by PCR: NEGATIVE
Influenza B by PCR: NEGATIVE
Resp Syncytial Virus by PCR: NEGATIVE
SARS Coronavirus 2 by RT PCR: NEGATIVE

## 2022-10-21 LAB — COMPREHENSIVE METABOLIC PANEL
ALT: 15 U/L (ref 0–44)
AST: 23 U/L (ref 15–41)
Albumin: 3.9 g/dL (ref 3.5–5.0)
Alkaline Phosphatase: 57 U/L (ref 38–126)
Anion gap: 13 (ref 5–15)
BUN: 24 mg/dL — ABNORMAL HIGH (ref 8–23)
CO2: 25 mmol/L (ref 22–32)
Calcium: 9.4 mg/dL (ref 8.9–10.3)
Chloride: 107 mmol/L (ref 98–111)
Creatinine, Ser: 0.88 mg/dL (ref 0.61–1.24)
GFR, Estimated: >60 mL/min (ref >60)
Glucose, Bld: 136 mg/dL — ABNORMAL HIGH (ref 70–99)
Potassium: 3.6 mmol/L (ref 3.5–5.1)
Sodium: 145 mmol/L (ref 135–145)
Total Bilirubin: 2.1 mg/dL — ABNORMAL HIGH (ref 0.3–1.2)
Total Protein: 7.4 g/dL (ref 6.5–8.1)

## 2022-10-21 MED ORDER — SODIUM CHLORIDE 0.9 % IV BOLUS
500.0000 mL | Freq: Once | INTRAVENOUS | Status: AC
  Administered 2022-10-21: 500 mL via INTRAVENOUS

## 2022-10-21 NOTE — ED Notes (Signed)
Meal was provided. Family at bedside assisting with meal.
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
## 2022-10-21 NOTE — ED Notes (Signed)
Pt sleeping, chest rise and fall noted. Family at bedside updated on care plan.

## 2022-10-21 NOTE — ED Notes (Signed)
Patient transported to MRI 

## 2022-10-21 NOTE — TOC Transition Note (Signed)
Transition of Care [facility]) - CM/SW Discharge Note   Patient Details  Name: Tim Barrett MRN: [id number] Date of Birth: 09/15/1940  Transition of Care [facility]) CM/SW Contact:  Tiburcio Bash, LCSW Phone Number: 10/21/2022, 9:15 AM   Clinical Narrative:     Patient to discharge home today with home health services through [place], [place] OT and RN. Cory with [facility] informed of discharge today. No dme needs per family. Daughter reports they will pick patient up around 10 am today, treatment team informed.   No further discharge needs at this time.    Final next level of care: Home w Home Health Services Barriers to Discharge: No Barriers Identified   Patient Goals and CMS Choice CMS Medicare.gov Compare Post Acute Care list provided to:: Patient Represenative (must comment) (spouse and daughter) Choice offered to / list presented to : Adult Children  Discharge Placement                      Patient and family notified of of transfer: 10/21/22  Discharge Plan and Services Additional resources added to the After Visit Summary for                            [facility] Arranged: PT, OT, RN [facility] Agency: [place] Date [facility] Agency Contacted: 10/21/22 Time [place]: [id number] Representative spoke with at [place]: [place] ([place]) Interventions SDOH Screenings   Tobacco Use: Medium Risk (10/14/2022)     Readmission Risk Interventions     No data to display

## 2022-10-21 NOTE — ED Provider Notes (Signed)
Emergency Medicine Observation Re-evaluation Note  [name] is a 82 y.o. male, seen in the emergency department for psychiatric complaint.  No acute events since last update.  Physical Exam  BP (!) 140/78   Pulse 89   Temp 97.8 F (36.6 C) (Axillary)   Resp 14   SpO2 98%    ED Course / MDM   No recent lab work for review  Plan  Current plan is for placement to an appropriate living facility once available.  Social work is involved with the placement.    Harvest Dark, MD 10/21/22 [phone]
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
## 2022-10-21 NOTE — ED Notes (Addendum)
Pt cleaned of urine- new brief and chux applied, pt repositioned in bed. Male purewick applied. Sacrum appears intact, redness noted. Old yellow/purple bruising noted on internal right upper thigh/groin.

## 2022-10-21 NOTE — ED Notes (Signed)
VOL  PENDING  PLACEMENT 

## 2022-10-21 NOTE — ED Notes (Signed)
VOL/pending palcement

## 2022-10-22 DIAGNOSIS — F03918 Unspecified dementia, unspecified severity, with other behavioral disturbance: Secondary | ICD-10-CM | POA: Diagnosis not present

## 2022-10-22 LAB — URINALYSIS, ROUTINE W REFLEX MICROSCOPIC
Bacteria, UA: NONE SEEN
Bilirubin Urine: NEGATIVE
Glucose, UA: 50 mg/dL — AB
Hgb urine dipstick: NEGATIVE
Ketones, ur: 20 mg/dL — AB
Leukocytes,Ua: NEGATIVE
Nitrite: NEGATIVE
Protein, ur: 30 mg/dL — AB
Specific Gravity, Urine: 1.031 — ABNORMAL HIGH (ref 1.005–1.030)
Squamous Epithelial / HPF: NONE SEEN /HPF (ref 0–5)
pH: 5 (ref 5.0–8.0)

## 2022-10-22 MED ORDER — ALFUZOSIN HCL ER 10 MG PO TB24
10.0000 mg | ORAL_TABLET | Freq: Every day | ORAL | Status: DC
  Administered 2022-10-23 – 2022-10-26 (×4): 10 mg via ORAL
  Filled 2022-10-22 (×9): qty 1

## 2022-10-22 MED ORDER — HALOPERIDOL LACTATE 5 MG/ML IJ SOLN
5.0000 mg | Freq: Once | INTRAMUSCULAR | Status: AC
  Administered 2022-10-22: 5 mg via INTRAVENOUS
  Filled 2022-10-22: qty 1

## 2022-10-22 MED ORDER — PANTOPRAZOLE SODIUM 40 MG IV SOLR
40.0000 mg | Freq: Every day | INTRAVENOUS | Status: DC
  Administered 2022-10-23 – 2022-10-25 (×3): 40 mg via INTRAVENOUS
  Filled 2022-10-22 (×4): qty 10

## 2022-10-22 MED ORDER — PANTOPRAZOLE SODIUM 40 MG PO TBEC
40.0000 mg | DELAYED_RELEASE_TABLET | Freq: Every day | ORAL | Status: DC
  Administered 2022-10-22: 40 mg via ORAL
  Filled 2022-10-22: qty 1

## 2022-10-22 NOTE — ED Notes (Signed)
Went back into patients room to reconnect pulse ox and talk with patient and he was much more tolerable and less agitated.

## 2022-10-22 NOTE — ED Notes (Addendum)
Patient continuously attempting to get out of bed and when attempting to redirect patient grabs hold tight of cords or this RN or NT arms. Occasionally can easily directed but at this time not easily directed.

## 2022-10-22 NOTE — ED Notes (Signed)
Crystal, RN, and this tech assisted pt in ambulating to the toilet. Pt unable to void at this time. Brief changed and pt ambulated with 2 person assistance back to the bed. Side rails up, pads on rails, and bed alarm set for pts safety. Pt given entertainment mat and tv turned on to soccer, per pts preference.

## 2022-10-22 NOTE — Consult Note (Signed)
[facility]iatry Consult   Reason for Consult:  Reassessment Referring Physician:  EDP Patient Identification: Tim Barrett MRN:  [id number] Principal Diagnosis: Dementia with behavioral disturbance [facility]) Diagnosis:  Principal Problem:   Dementia with behavioral disturbance ([place])   Total Time spent with patient: 45 minutes  Subjective:   Tim Barrett is a 82 y.o. male patient admitted with behavior changes.  HPI:  82 yo male admitted for behavior changes related to progressive dementia.  He has been in the ED for a week and continues to be pleasantly confused.  No behavior issues noted this week.  Evidently, prior to admission, he got upset that he could not drive.  His wife is also elderly and can no longer care for him.  No threats to self or others, psychosis, or substance abuse.  He would benefit from a SNF and/or memory care facility.  Caveat:  He remained in the ED in a room without needing a sitter, he does need assistance with ADLs.  Past Psychiatric History: dementia  Risk to Self:  none Risk to Others:  none Prior Inpatient Therapy:  none Prior Outpatient Therapy:  PCP  Past Medical History:  Past Medical History:  Diagnosis Date   Acid reflux    BPH (benign prostatic hyperplasia)     Past Surgical History:  Procedure Laterality Date   HERNIA REPAIR     x4   LAMINECTOMY  1989   Family History:  Family History  Problem Relation Age of Onset   Heart attack Father    Prostate cancer Neg Hx    Hematuria Neg Hx    Kidney cancer Neg Hx    Bladder Cancer Neg Hx    Family Psychiatric  History: none Social History:  Social History   Substance and Sexual Activity  Alcohol Use No     Social History   Substance and Sexual Activity  Drug Use No    Social History   Socioeconomic History   Marital status: Married    Spouse name: Not on file   Number of children: Not on file   Years of education: Not on file   Highest education level: Not on file   Occupational History   Not on file  Tobacco Use   Smoking status: Former    Types: Cigarettes    Quit date: 05/18/2001    Years since quitting: 21.4   Smokeless tobacco: Never  Substance and Sexual Activity   Alcohol use: No   Drug use: No   Sexual activity: Not on file  Other Topics Concern   Not on file  Social History Narrative   Not on file   Social Determinants of Health   Financial Resource Strain: Not on file  Food Insecurity: Not on file  Transportation Needs: Not on file  Physical Activity: Not on file  Stress: Not on file  Social Connections: Not on file   Additional Social History:  elderly wife    Allergies:   Allergies  Allergen Reactions   Bactrim [Sulfamethoxazole-Trimethoprim]     Weakness, hot flashes    Labs:  Results for orders placed or performed during the hospital encounter of 10/14/22 (from the past 48 hour(s))  Comprehensive metabolic panel     Status: Abnormal   Collection Time: 10/21/22 10:29 AM  Result Value Ref Range   Sodium 145 135 - 145 mmol/L   Potassium 3.6 3.5 - 5.1 mmol/L   Chloride 107 98 - 111 mmol/L   CO2 25  22 - 32 mmol/L   Glucose, Bld 136 (H) 70 - 99 mg/dL    Comment: Glucose reference range applies only to samples taken after fasting for at least 8 hours.   BUN 24 (H) 8 - 23 mg/dL   Creatinine, Ser 0.88 0.61 - 1.24 mg/dL   Calcium 9.4 8.9 - 10.3 mg/dL   Total Protein 7.4 6.5 - 8.1 g/dL   Albumin 3.9 3.5 - 5.0 g/dL   AST 23 15 - 41 U/L   ALT 15 0 - 44 U/L   Alkaline Phosphatase 57 38 - 126 U/L   Total Bilirubin 2.1 (H) 0.3 - 1.2 mg/dL   GFR, Estimated >60 >60 mL/min    Comment: (NOTE) Calculated using the CKD-EPI Creatinine Equation (2021)    Anion gap 13 5 - 15    Comment: Performed at [facility], [address] [place], [place] [address]  CBC with Differential     Status: None   Collection Time: 10/21/22 10:29 AM  Result Value Ref Range   WBC 7.5 4.0 - 10.5 K/uL   RBC 4.33 4.22 - 5.81 MIL/uL    Hemoglobin 13.6 13.0 - 17.0 g/dL   HCT 40.6 39.0 - 52.0 %   MCV 93.8 80.0 - 100.0 fL   MCH 31.4 26.0 - 34.0 pg   MCHC 33.5 30.0 - 36.0 g/dL   RDW 12.6 11.5 - 15.5 %   Platelets 198 150 - 400 K/uL   nRBC 0.0 0.0 - 0.2 %   Neutrophils Relative % 79 %   Neutro Abs 5.8 1.7 - 7.7 K/uL   Lymphocytes Relative 11 %   Lymphs Abs 0.8 0.7 - 4.0 K/uL   Monocytes Relative 10 %   Monocytes Absolute 0.7 0.1 - 1.0 K/uL   Eosinophils Relative 0 %   Eosinophils Absolute 0.0 0.0 - 0.5 K/uL   Basophils Relative 0 %   Basophils Absolute 0.0 0.0 - 0.1 K/uL   Immature Granulocytes 0 %   Abs Immature Granulocytes 0.03 0.00 - 0.07 K/uL    Comment: Performed at [facility], [address] [place], [place] [address]  Urinalysis, Routine w reflex microscopic -Urine, Clean Catch     Status: Abnormal   Collection Time: 10/21/22 12:11 PM  Result Value Ref Range   Color, Urine AMBER (A) YELLOW    Comment: BIOCHEMICALS MAY BE AFFECTED BY COLOR   APPearance CLEAR (A) CLEAR   Specific Gravity, Urine 1.031 (H) 1.005 - 1.030   pH 5.0 5.0 - 8.0   Glucose, UA 50 (A) NEGATIVE mg/dL   Hgb urine dipstick NEGATIVE NEGATIVE   Bilirubin Urine NEGATIVE NEGATIVE   Ketones, ur 20 (A) NEGATIVE mg/dL   Protein, ur 30 (A) NEGATIVE mg/dL   Nitrite NEGATIVE NEGATIVE   Leukocytes,Ua NEGATIVE NEGATIVE   RBC / HPF 0-5 0 - 5 RBC/hpf   WBC, UA 0-5 0 - 5 WBC/hpf   Bacteria, UA NONE SEEN NONE SEEN   Squamous Epithelial / HPF NONE SEEN 0 - 5 /HPF   Mucus PRESENT     Comment: Performed at [facility], [address] [place],  [address]  Resp panel by RT-PCR (RSV, Flu A&B, Covid) Anterior Nasal Swab     Status: None   Collection Time: 10/21/22  2:53 PM   Specimen: Anterior Nasal Swab  Result Value Ref Range   SARS Coronavirus 2 by RT PCR NEGATIVE NEGATIVE    Comment: (NOTE) SARS-CoV-2 target nucleic acids are NOT DETECTED.  The SARS-CoV-2 RNA is generally detectable in upper respiratory specimens  during the acute phase of infection. The lowest concentration of SARS-CoV-2 viral copies this assay can detect is 138 copies/mL. A negative result does not preclude SARS-Cov-2 infection and should not be used as the sole basis for treatment or other patient management decisions. A negative result may occur with  improper specimen collection/handling, submission of specimen other than nasopharyngeal swab, presence of viral mutation(s) within the areas targeted by this assay, and inadequate number of viral copies(<138 copies/mL). A negative result must be combined with clinical observations, patient history, and epidemiological information. The expected result is Negative.  Fact Sheet for Patients:  EntrepreneurPulse.com.au  Fact Sheet for Healthcare Providers:  IncredibleEmployment.be  This test is no t yet approved or cleared by the [place] FDA and  has been authorized for detection and/or diagnosis of SARS-CoV-2 by FDA under an Emergency Use Authorization (EUA). This EUA will remain  in effect (meaning this test can be used) for the duration of the COVID-19 declaration under Section 564(b)(1) of the Act, 21 U.S.C.section 360bbb-3(b)(1), unless the authorization is terminated  or revoked sooner.       Influenza A by PCR NEGATIVE NEGATIVE   Influenza B by PCR NEGATIVE NEGATIVE    Comment: (NOTE) The Xpert Xpress SARS-CoV-2/FLU/RSV plus assay is intended as an aid in the diagnosis of influenza from Nasopharyngeal swab specimens and should not be used as a sole basis for treatment. Nasal washings and aspirates are unacceptable for Xpert Xpress SARS-CoV-2/FLU/RSV testing.  Fact Sheet for Patients: EntrepreneurPulse.com.au  Fact Sheet for Healthcare Providers: IncredibleEmployment.be  This test is not yet approved or cleared by the [place] FDA and has been authorized for detection and/or diagnosis  of SARS-CoV-2 by FDA under an Emergency Use Authorization (EUA). This EUA will remain in effect (meaning this test can be used) for the duration of the COVID-19 declaration under Section 564(b)(1) of the Act, 21 U.S.C. section 360bbb-3(b)(1), unless the authorization is terminated or revoked.     Resp Syncytial Virus by PCR NEGATIVE NEGATIVE    Comment: (NOTE) Fact Sheet for Patients: EntrepreneurPulse.com.au  Fact Sheet for Healthcare Providers: IncredibleEmployment.be  This test is not yet approved or cleared by the [place] FDA and has been authorized for detection and/or diagnosis of SARS-CoV-2 by FDA under an Emergency Use Authorization (EUA). This EUA will remain in effect (meaning this test can be used) for the duration of the COVID-19 declaration under Section 564(b)(1) of the Act, 21 U.S.C. section 360bbb-3(b)(1), unless the authorization is terminated or revoked.  Performed at [facility], [address] [place], [place] [address]     Current Facility-Administered Medications  Medication Dose Route Frequency Provider Last Rate Last Admin   aspirin chewable tablet 81 mg  81 mg Oral Daily Blake Divine, MD   81 mg at 10/22/22 0943   divalproex (DEPAKOTE SPRINKLE) capsule 250 mg  250 mg Oral Q12H Blake Divine, MD   250 mg at 10/22/22 0944   donepezil (ARICEPT) tablet 5 mg  5 mg Oral QHS Patrecia Pour, NP   5 mg at 10/21/22 2228   risperiDONE (RISPERDAL) tablet 1 mg  1 mg Oral BID PRN Patrecia Pour, NP   1 mg at 10/18/22 0038   Current Outpatient Medications  Medication Sig Dispense Refill   aspirin EC 81 MG tablet Take 81 mg by mouth daily.     divalproex (DEPAKOTE) 250 MG DR tablet Take 250 mg by  mouth at bedtime.     donepezil (ARICEPT) 5 MG tablet Take 1 tablet by mouth at bedtime.     esomeprazole (NEXIUM) 20 MG capsule Take by mouth.     alfuzosin (UROXATRAL) 10 MG 24 hr tablet Take 1 tablet (10 mg  total) by mouth daily with breakfast. (Patient not taking: Reported on 10/15/2022) 30 tablet 11    Musculoskeletal: Strength & Muscle Tone: decreased Gait & Station:  did not witness Patient leans: N/A  Psychiatric Specialty Exam: Physical Exam Vitals and nursing note reviewed.  Constitutional:      Appearance: Normal appearance.  HENT:     Head: Normocephalic.     Nose: Nose normal.  Pulmonary:     Effort: Pulmonary effort is normal.  Musculoskeletal:     Cervical back: Normal range of motion.  Neurological:     General: No focal deficit present.     Mental Status: He is alert.  Psychiatric:        Attention and Perception: He is inattentive.        Mood and Affect: Affect is blunt.        Speech: Speech normal.        Behavior: Behavior is slowed. Behavior is cooperative.        Cognition and Memory: Cognition is impaired. Memory is impaired.     Review of Systems  Psychiatric/Behavioral:  Positive for memory loss.   All other systems reviewed and are negative.   Blood pressure 107/70, pulse 86, temperature 98.6 F (37 C), resp. rate 17, SpO2 98 %.There is no height or weight on file to calculate BMI.  General Appearance: Casual  Eye Contact:  Fair  Speech:  Normal Rate  Volume:  Normal  Mood:  Euthymic  Affect:  Blunt  Thought Process:  Irrelevant  Orientation:  Other:  person  Thought Content:   confusion  Suicidal Thoughts:  No  Homicidal Thoughts:  No  Memory:  Immediate;   Poor Recent;   Poor Remote;   Poor  Judgement:  Fair  Insight:  Unable to assess  Psychomotor Activity:  Decreased  Concentration:  Concentration: Fair and Attention Span: Fair  Recall:  Poor  Fund of Knowledge:  UTA  Language:  Fair  Akathisia:  No  Handed:  Right  AIMS (if indicated):     Assets:  Resilience Social Support  ADL's:  Impaired  Cognition:  Impaired,  Moderate  Sleep:        Physical Exam: Physical Exam Vitals and nursing note reviewed.  Constitutional:       Appearance: Normal appearance.  HENT:     Head: Normocephalic.     Nose: Nose normal.  Pulmonary:     Effort: Pulmonary effort is normal.  Musculoskeletal:     Cervical back: Normal range of motion.  Neurological:     General: No focal deficit present.     Mental Status: He is alert.  Psychiatric:        Attention and Perception: He is inattentive.        Mood and Affect: Affect is blunt.        Speech: Speech normal.        Behavior: Behavior is slowed. Behavior is cooperative.        Cognition and Memory: Cognition is impaired. Memory is impaired.    Review of Systems  Psychiatric/Behavioral:  Positive for memory loss.   All other systems reviewed and are negative.  Blood pressure 107/70, pulse 86,  temperature 98.6 F (37 C), resp. rate 17, SpO2 98 %. There is no height or weight on file to calculate BMI.  Treatment Plan Summary: Dementia with behavioral changes: Depakote 250 mg BID Aricept 5 mg daily Risperdal 1 mg BID PRN  Disposition: No evidence of imminent risk to self or others at present.   Patient does not meet criteria for psychiatric inpatient admission. Supportive therapy provided about ongoing stressors.  Waylan Boga, NP 10/22/2022 12:43 PM

## 2022-10-22 NOTE — ED Notes (Signed)
Patient is vol pending possible placement

## 2022-10-22 NOTE — TOC Progression Note (Signed)
Transition of Care [facility]) - Progression Note    Patient Details  Name: Tim Barrett MRN: [id number] Date of Birth: August 13, 1941  Transition of Care [facility]) CM/SW [employer], [place] Phone Number: 10/22/2022, 1:50 PM  Clinical Narrative:     Psych note in, pt faxed out to local SNFs at family request, hoping for [facility].    Expected Discharge Plan: Memory Care Barriers to Discharge: No Barriers Identified  Expected Discharge Plan and Services                                   HH Arranged: PT, OT, RN [facility] Agency: [place] Date [facility] Agency Contacted: 10/21/22 Time [place]: [phone] Representative spoke with at [place]: [place] Determinants of Health ([place]) Interventions SDOH Screenings   Tobacco Use: Medium Risk (10/14/2022)    Readmission Risk Interventions     No data to display

## 2022-10-22 NOTE — ED Notes (Signed)
Assumed care of patient. Patient is resting in bed.

## 2022-10-22 NOTE — TOC Initial Note (Addendum)
Transition of Care [facility]) - Initial/Assessment Note    Patient Details  Name: Tim Barrett MRN: [id number] Date of Birth: 1940/10/03  Transition of Care [facility]) CM/SW Contact:    [address], [place] Phone Number: 10/22/2022, 11:01 AM  Clinical Narrative:                 CSW spoke with pt's daughter concerning SNF recommendation by PT, explained process for STR, insurance authorization and Medicare.gov ratings website . Pt's daughter concerned about pt's behaviors and whether or not he will be placed. CSW and pt's daughter spoke about PMT (recommended by Speech), agreeable to speaking with PMT. Pt's daughter working with Andee Poles with [employer], CSW will reach out to [place] to update on PT recommendations. Pt's daughter concerned about safety of the family with pt's behavior change over the last two weeks. Daughter does want Radiation protection practitioner, CSW requested an updated Psych consult/note, last one is from 5 days ago. MD notified for assistance with PMT and Psych. TOC will continue to follow.     Expected Discharge Plan: Memory Care Barriers to Discharge: No Barriers Identified   Patient Goals and CMS Choice Patient states their goals for this hospitalization and ongoing recovery are:: to go home CMS Medicare.gov Compare Post Acute Care list provided to:: Patient Represenative (must comment) (spouse and daughter) Choice offered to / list presented to : Adult Children      Expected Discharge Plan and Services                                   HH Arranged: PT, OT, RN [facility] Agency: [place] Date [facility] Agency Contacted: 10/21/22 Time [place]: [phone] Representative spoke with at [place]: Tommi Rumps  Prior Living Arrangements/Services                       Activities of Daily Living      Permission Sought/Granted                  Emotional Assessment              Admission diagnosis:  voluntary Patient Active Problem List   Diagnosis  Date Noted   Suicidal behavior with attempted self-injury ([place]) 10/15/2022   Dementia with agitation ([place]) 10/15/2022   Hypercholesterolemia 01/20/2014   Peptic ulcer disease 01/20/2014   PCP:  Baxter Hire, MD Pharmacy:   [place], [place] [address] [place] [address] Phone: [phone] Fax: [phone]     Social Determinants of Health (SDOH) Social History: SDOH Screenings   Tobacco Use: Medium Risk (10/14/2022)   SDOH Interventions:     Readmission Risk Interventions     No data to display

## 2022-10-22 NOTE — ED Notes (Signed)
Pt family member requesting to speak with pt doctor. RN notified.

## 2022-10-22 NOTE — ED Notes (Addendum)
Pt given meal tray when asked if patient was hungry and would like to eat patient denied wanting any food at this time. Will attempt to see if patient will eat at a later point. Currently watching golf on the television.

## 2022-10-22 NOTE — ED Notes (Signed)
Attempted to clean patients face with warm wipes and to clean patients mouth but patient was agitated, grasping my arms and not tolerable.
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
## 2022-10-22 NOTE — ED Provider Notes (Signed)
**Note Tim Barrett** Emergency Medicine Observation Re-evaluation Note  Tim Barrett is a 82 y.o. male, seen on rounds today.  Pt initially presented to the ED for complaints of Mental Health Problem  Currently, the patient is calm, no acute complaints.  Physical Exam  Blood pressure 102/88, pulse 89, temperature 98.8 F (37.1 C), temperature source Oral, resp. rate 16, SpO2 100 %. Physical Exam General: NAD Lungs: CTAB Psych: not agitated  ED Course / MDM  EKG:    I have reviewed the labs performed to date as well as medications administered while in observation.  Recent changes in the last 24 hours include no acute events overnight.  Pt became more agitated this evening, likely 'sundowning,' was given IV haldol to calm him due to difficulty getting to take oral risperdal  Plan  Current plan is for [facility] placement   Carrie Mew, MD 10/22/22 2117

## 2022-10-22 NOTE — ED Notes (Signed)
Walked into room. Patient was watching TV.

## 2022-10-23 NOTE — ED Notes (Addendum)
Pt awake and attempting to get out of bed. Pt ambulated from bed to recliner chair with assistance. Pt is very unsteady on his feet. Bed alarm placed on recliner chair.

## 2022-10-23 NOTE — ED Notes (Signed)
VOL TOC placement

## 2022-10-23 NOTE — ED Notes (Signed)
Patient was found trying to get out of bed. Patient was helped to the commode to use the bathroom. Patient was unable to make it and became incontinent of urine. Patient was cleaned and assisted back to the bed. Patient is now resting in bed. Patient was offered water to drink. States he is going back to sleep.

## 2022-10-23 NOTE — ED Notes (Signed)
Pt currently trying to get out of recliner. This RN currently at pt bedside to minimize risk of falling. Pt cooperative at this time.

## 2022-10-23 NOTE — ED Notes (Signed)
Pt attempting to get out of recliner. Pt assisted to bed and new brief placed on pt. Warm blanket provided. No other needs at this time.

## 2022-10-23 NOTE — ED Notes (Signed)
Pt awake in recliner chair, attempting to get out chair. Pt redirectable, and remaining in chair at this time. Pt given breakfast tray, and refused to eat more than a couple bites. Pt given ice cream and currently eating. Charge nurse notified that pt will need a Air cabin crew.

## 2022-10-23 NOTE — ED Notes (Signed)
Patient was provided with some water to drink and a warm blanket.

## 2022-10-23 NOTE — ED Notes (Signed)
Pt sitting in recliner eating ice cream.

## 2022-10-23 NOTE — ED Notes (Signed)
Patient resting comfortably in recliner with eyes closed at this time. Respirations even and unlabored.

## 2022-10-24 DIAGNOSIS — S71111A Laceration without foreign body, right thigh, initial encounter: Secondary | ICD-10-CM

## 2022-10-24 DIAGNOSIS — Z515 Encounter for palliative care: Secondary | ICD-10-CM | POA: Diagnosis not present

## 2022-10-24 DIAGNOSIS — Z66 Do not resuscitate: Secondary | ICD-10-CM

## 2022-10-24 DIAGNOSIS — F03918 Unspecified dementia, unspecified severity, with other behavioral disturbance: Secondary | ICD-10-CM | POA: Diagnosis not present

## 2022-10-24 DIAGNOSIS — S21112A Laceration without foreign body of left front wall of thorax without penetration into thoracic cavity, initial encounter: Secondary | ICD-10-CM

## 2022-10-24 MED ORDER — ACETAMINOPHEN 500 MG PO TABS
1000.0000 mg | ORAL_TABLET | Freq: Once | ORAL | Status: AC
  Administered 2022-10-24: 1000 mg via ORAL
  Filled 2022-10-24: qty 2

## 2022-10-24 NOTE — ED Notes (Signed)
Pt with unsteady gait, requires 1x assist.

## 2022-10-24 NOTE — ED Notes (Signed)
Pt attempting to get out of bed multiple times since 0000 - easily redirected with therapeutic communication and verbal redirection.

## 2022-10-24 NOTE — ED Notes (Signed)
Patient alert, calm and cooperative. Patient ate milkshake and expressed gratitude towards staff. Declines additional needs.

## 2022-10-24 NOTE — ED Notes (Signed)
Pt trying to climb out of bed. Pt placed back in bed by this RN and Paige EDT. Pt pulling at IV. Mittens placed on pt.

## 2022-10-24 NOTE — ED Notes (Signed)
Patient has his meal tray at bedside.

## 2022-10-24 NOTE — ED Notes (Addendum)
Pt ambulated with assistance to recliner, Pt wiped down with bath wipes, new gown and new brief applied. Pt ate approx. 3 spoonfuls of yogurt from breakfast tray. Pt teeth brushed. Pt resting comfortably in recliner at this time. Pt able to follow commands.

## 2022-10-24 NOTE — ED Notes (Addendum)
Palliative Care NP Strup at the bedside speaking with pt's wife.

## 2022-10-24 NOTE — ED Notes (Signed)
New bed linens applied.

## 2022-10-24 NOTE — ED Notes (Signed)
Pt's wife at the bedside

## 2022-10-24 NOTE — ED Notes (Signed)
Assumed care of patient. Patient is in bed, with legs hanging off the sides. Sitter and other nursing staff is attempting to reorient the patient and places his legs back in the bed.

## 2022-10-24 NOTE — Consult Note (Signed)
Consultation Note Date: 10/24/2022 at 0930  Patient Name: Tim Barrett  DOB: 30-May-1941  MRN: [id number]  Age / Sex: 82 y.o., male  PCP: Baxter Hire, MD Referring Physician: No att. providers found  Reason for Consultation: Establishing goals of care  HPI/Patient Profile: 82 y.o. male  with past medical history of dementia admitted on 10/14/2022 with aggressive behaviors.   Clinical Assessment and Goals of Care: I have reviewed medical records including EPIC notes, labs and imaging, assessed the patient and then met with patient's wife and daughter [name] in family consult room of ED to discuss diagnosis prognosis, GOC, EOL wishes, disposition and options.  I introduced Palliative Medicine as specialized medical care for people living with serious illness. It focuses on providing relief from the symptoms and stress of a serious illness. The goal is to improve quality of life for both the patient and the family.  We discussed a brief life review of the patient.  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 He is married and has two daughters. Family shares he walks and is active every single day.  They noticed his dementia started a few years ago with some memory loss.  However, they have seen a significant and drastic decline in his functional, cognitive, and nutritional status over the past 2 weeks.  We discussed patient's current illness and what it means in the larger context of patient's on-going co-morbidities.  Natural disease trajectory and expectations at EOL were discussed. Discussed dementia as a chronic, progressive, and irreversible disease.  I attempted to elicit values and goals of care important to the patient.  Patient's wife and daughter were clear they want patient to be able to be more mobile.  They are hopeful that he can find a safe place where he can be active but  still have nursing care to help manage his dementia behaviors.  Advance directives, concepts specific to code status, artificial feeding and hydration, and rehospitalization were considered and discussed.  Family does not know of advanced care planning documentation or living will that patient has created in the past.  In the event of a cardiopulmonary arrest, patient's wife and daughter are in agreement to allow a natural death.  They would not want patient to accept CPR, defibrillation, or use of mechanical ventilation to sustain his life.  Patient's CODE STATUS changed to DNR.  Nursing and attending made aware.  Education offered regarding concept specific to human mortality and the limitations of medical interventions to prolong life when the body begins to fail to thrive.  Therapeutic silence and active listening provided for patient's wife and daughter to share their thoughts and emotions regarding patient's current health status.  Family is facing treatment option decisions, advanced directive, and anticipatory care needs.  TOC is following closely for placement.   Discussed with patient/family the importance of continued conversation with family and the medical providers regarding overall plan of care and treatment options, ensuring decisions are within the context of  the patient's values and GOCs.    Palliative Care services outpatient were explained and offered.  Ongoing discussions and support to be provided.  PMT will continue to follow.  Questions and concerns were addressed. The family was encouraged to call with questions or concerns.   Primary Decision Maker NEXT OF KIN  Physical Exam Vitals reviewed.  Constitutional:      General: He is not in acute distress.    Appearance: He is normal weight. He is not ill-appearing.  HENT:     Head: Normocephalic.     Mouth/Throat:     Mouth: Mucous membranes are moist.  Eyes:     Pupils: Pupils are equal, round, and reactive to  light.  Cardiovascular:     Rate and Rhythm: Normal rate.     Pulses: Normal pulses.  Pulmonary:     Effort: Pulmonary effort is normal.  Abdominal:     Palpations: Abdomen is soft.  Musculoskeletal:        General: Normal range of motion.     Comments: MAETC  Skin:    General: Skin is warm and dry.  Neurological:     Mental Status: He is alert.     Comments: Oriented to self  Psychiatric:        Mood and Affect: Mood normal.        Behavior: Behavior normal.        Judgment: Judgment normal.     Palliative Assessment/Data: 70%     Thank you for this consult. Palliative medicine will continue to follow and assist holistically.   Time Total: 75 minutes Greater than 50%  of this time was spent counseling and coordinating care related to the above assessment and plan.  Signed by: Jordan Hawks, DNP, FNP-BC Palliative Medicine    Please contact Palliative Medicine Team phone at [phone] for questions and concerns.  For individual provider: See Shea Evans
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
## 2022-10-24 NOTE — ED Notes (Signed)
Patient is agitated. Patient is currently trying to get out of bed continuously multiple times. Patient was putting his legs over the bed rails. Patient will not stay in bed. Another RN came to assist with EDT to reposition him in bed. Patient states he is not comfortable, however has been reposition multiple times.

## 2022-10-24 NOTE — ED Provider Notes (Signed)
-----------------------------------------   4:57 AM on 10/24/2022 -----------------------------------------   Blood pressure 130/81, pulse 100, temperature 98.5 F (36.9 C), temperature source Oral, resp. rate 16, SpO2 97 %.  The patient is calm and cooperative at this time.  There have been no acute events since the last update.  Awaiting disposition plan from case management/social work.     Fremon Zacharia, Delice Bison, DO 10/24/22 [phone]

## 2022-10-24 NOTE — TOC Progression Note (Signed)
Transition of Care [facility]) - Progression Note    Patient Details  Name: Tim Barrett MRN: [id number] Date of Birth: [birth year]  Transition of Care [facility]) CM/SW [place],  Phone Number: 10/24/2022, 9:18 AM  Clinical Narrative:     CSW spoke with patient's daughter [name], they are still interested in pursuing snf placement per PT recommendations at this time. Preference for [facility]. [name] agreeable for larger SNF search to be initiated as no bed offers this morning, CSW has sent out additional referrals. Pending bed offers at this time.    Expected Discharge Plan: Memory Care Barriers to Discharge: No Barriers Identified  Expected Discharge Plan and Services                                   HH Arranged: PT, OT, RN [facility] Agency: [place] Date [facility] Agency Contacted: 10/21/22 Time [place]: [phone] Representative spoke with at [place]: [place] Determinants of Health ([place]) Interventions SDOH Screenings   Tobacco Use: Medium Risk (10/14/2022)    Readmission Risk Interventions     No data to display

## 2022-10-24 NOTE — ED Notes (Signed)
Pt complaining of discomfort, Md [place] notified.

## 2022-10-24 NOTE — ED Notes (Signed)
Patient constantly denies his meal tray, but pt did drink some ice tea.

## 2022-10-24 NOTE — ED Notes (Signed)
Vol / pending TOC placement

## 2022-10-25 DIAGNOSIS — F03911 Unspecified dementia, unspecified severity, with agitation: Secondary | ICD-10-CM | POA: Diagnosis not present

## 2022-10-25 DIAGNOSIS — S21112A Laceration without foreign body of left front wall of thorax without penetration into thoracic cavity, initial encounter: Secondary | ICD-10-CM | POA: Diagnosis not present

## 2022-10-25 DIAGNOSIS — S71111A Laceration without foreign body, right thigh, initial encounter: Secondary | ICD-10-CM | POA: Diagnosis not present

## 2022-10-25 DIAGNOSIS — F03918 Unspecified dementia, unspecified severity, with other behavioral disturbance: Secondary | ICD-10-CM | POA: Diagnosis not present

## 2022-10-25 MED ORDER — DROPERIDOL 2.5 MG/ML IJ SOLN
5.0000 mg | Freq: Once | INTRAMUSCULAR | Status: AC
  Administered 2022-10-25: 5 mg via INTRAVENOUS

## 2022-10-25 MED ORDER — DROPERIDOL 2.5 MG/ML IJ SOLN
5.0000 mg | Freq: Once | INTRAMUSCULAR | Status: DC
  Filled 2022-10-25: qty 2

## 2022-10-25 NOTE — ED Notes (Signed)
Pt constantly attempting to get out of bed and requesting to leave. Pt with forceful grip to bed when attempting to move patient up in bed. Pt pushing staffs arms in attempt to get up. Unable to redirect patient. IV medication ordered and administered as ordered. 1 to 1 sitter at bedside at this time.

## 2022-10-25 NOTE — ED Notes (Addendum)
Pt unwilling to take PO meds at this time. Will attempt to offer meds again later.  Pt resting in bed with basketball game on the tv, per pts preference, and an activity mat on the pts lap for engagement. Side rails up and bed alarm on to promote safety. Safety sitter at bedside.

## 2022-10-25 NOTE — ED Notes (Signed)
This Probation officer tried to feed pt declined. Pt only ate a few bits of ice cream.

## 2022-10-25 NOTE — ED Notes (Signed)
Pt cleaned up with RN Deneise Lever. Pt provide new clean sheets and brief at this time. Pt given warm blanket and resting at this time. 1:1 sitter present

## 2022-10-25 NOTE — ED Notes (Addendum)
Patient was tried with all foods at lunch. Patient spit out every bite as it was offered

## 2022-10-25 NOTE — ED Notes (Signed)
Patient had an episode of hypotension when PT attempted to walk the patient this morning.
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
## 2022-10-25 NOTE — TOC Progression Note (Addendum)
Transition of Care [facility]) - Progression Note    Patient Details  Name: Tim Barrett MRN: [id number] Date of Birth: [birth year]  Transition of Care [facility]) CM/SW [place], [place] Phone Number: 10/25/2022, 12:32 PM  Clinical Narrative:     Update: CSW spoke with [facility] supervisor regarding difficult placement. Per supervisor plan to follow up with [facility] for placement options. CSW spoke with Andee Poles with [facility] who reports she has some placement ideas that she will look into and call family to discuss.      CSW spoke with patient's daughter [name], provided update that currently there are no bed offers, Most facilities are commenting they cannot meet patients needs.   CSW notes current barriers to placement include documentation that patient has been agitated and attempting to get out of bed, noted to have had a sitter 3/4. Hx of suicidal ideation. CSW explained these barriers to patient's daughter.  CSW to consult [facility] supervisor this afternoon to review case to ensure all placement option are being considered.   Expected Discharge Plan: Memory Care Barriers to Discharge: No Barriers Identified  Expected Discharge Plan and Services                                   HH Arranged: PT, OT, RN [facility] Agency: [place] Date [facility] Agency Contacted: 10/21/22 Time [place]: [phone] Representative spoke with at [place]: [place] Determinants of Health ([place]) Interventions SDOH Screenings   Tobacco Use: Medium Risk (10/14/2022)    Readmission Risk Interventions     No data to display

## 2022-10-25 NOTE — ED Notes (Signed)
VOL/ TOC  PLACEMENT

## 2022-10-25 NOTE — ED Provider Notes (Signed)
-----------------------------------------   6:08 AM on 10/25/2022 -----------------------------------------   Blood pressure 133/79, pulse (!) 105, temperature 97.7 F (36.5 C), temperature source Oral, resp. rate 18, SpO2 96 %.  The patient is calm and cooperative at this time.  There have been no acute events since the last update.  Awaiting disposition plan from Social Work team.   Paulette Blanch, MD 10/25/22 [phone]
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
## 2022-10-25 NOTE — ED Notes (Signed)
Pt seem attempting to exit bed. Tech assisted pt to sit up. Pt ambulated from bed to recliner with this tech providing supportive assistance. Pts brief, gown, bed sheet, and chux changed at this time. Pt set up in recliner. Recliner lined with a sheet and clean chux. Bed alarm under pt and bed alarm is activated.   Pt covered with a blanket, provided with a pillow, activity mat in pts lap, and chair reclined for pts comfort. Pt unable to drink water from a stray at this time. Pt unable to take PO meds as well. Pt has not been willing to drink anything for me since I assumed care of this pt at 19:00.

## 2022-10-25 NOTE — ED Notes (Addendum)
This tech assumed the role of being a Air cabin crew for this pt. Pt has no needs at this time. Safety sitter at bedside, bed rails up, and bed alarm activated for pt safety. Will continue to monitor.

## 2022-10-25 NOTE — ED Notes (Signed)
Patient was assisted from the bed to the recliner. Patient was clean and dry at the time. Patient was pleasant in demeanor at the time.

## 2022-10-26 DIAGNOSIS — F03911 Unspecified dementia, unspecified severity, with agitation: Secondary | ICD-10-CM | POA: Diagnosis not present

## 2022-10-26 DIAGNOSIS — S21112A Laceration without foreign body of left front wall of thorax without penetration into thoracic cavity, initial encounter: Secondary | ICD-10-CM | POA: Diagnosis not present

## 2022-10-26 DIAGNOSIS — F03918 Unspecified dementia, unspecified severity, with other behavioral disturbance: Secondary | ICD-10-CM | POA: Diagnosis not present

## 2022-10-26 DIAGNOSIS — S71111A Laceration without foreign body, right thigh, initial encounter: Secondary | ICD-10-CM | POA: Diagnosis not present

## 2022-10-26 NOTE — ED Notes (Signed)
Patient rounded on. Patient resting in chair watching TV. Patient and sitter denies needs at this time.

## 2022-10-26 NOTE — ED Notes (Signed)
Patient attempted to use the bathroom.

## 2022-10-26 NOTE — ED Notes (Signed)
Patient rounded on. Patient is resting in chair with wife present in his room. Patient denies needs at this time.

## 2022-10-26 NOTE — ED Notes (Signed)
Assigned 1:1 Air cabin crew.  Pt asleep in bed

## 2022-10-26 NOTE — ED Provider Notes (Signed)
-----------------------------------------   6:34 AM on 10/26/2022 -----------------------------------------   Blood pressure 133/79, pulse (!) 105, temperature 97.7 F (36.5 C), temperature source Oral, resp. rate 18, SpO2 96 %.  The patient is calm and cooperative at this time.  There have been no acute events since the last update.  Awaiting disposition plan from Social Work team.   Paulette Blanch, MD 10/26/22 [phone]

## 2022-10-26 NOTE — ED Notes (Signed)
Patient resting in bed, watching TV with sitter at door. Patient breathing easy, regular and unlabored.

## 2022-10-26 NOTE — ED Notes (Signed)
RN sitting with patient while ER sitter used restroom and collected her food from the refrigerator. Patient remand asleep while ER sitter was away. Patient resting comfortable with regular, unlabored breathing.

## 2022-10-26 NOTE — ED Notes (Signed)
Patient's wife present. PT present evaluating patient at this time. Patient seen walking ER halls with assistance.

## 2022-10-26 NOTE — ED Notes (Signed)
VOL/TOC Placement
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
## 2022-10-26 NOTE — ED Notes (Signed)
Patient repositioned. Patient resting in bed watching TV. Patient denies needs at this time.
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
## 2022-10-26 NOTE — ED Notes (Signed)
Patient rounded on and is resting in bed watching TV with sitter at bedside.

## 2022-10-26 NOTE — ED Notes (Addendum)
Pt's dinner tray delivered to room. This EDT tried to get him to eat. Pt shook his head no when the main course of his food was offered to him. Pt did take a bite of peach puree and two bites of applesauce and a few sips of tea before he laid back down. EDT made sure he was comfortable and covered with a blanket. Pt is now laying down watching TV with EDT at bedside. EDT will offer him more of his lunch at a later time.

## 2022-10-26 NOTE — TOC Progression Note (Signed)
Transition of Care [facility]) - Progression Note    Patient Details  Name: Tim Barrett MRN: [id number] Date of Birth: 1941-03-12  Transition of Care [facility]) CM/SW [place], [place] Phone Number: 10/26/2022, 2:17 PM  Clinical Narrative:     Care Patrol to assess patient for in home and facility options with RN to be out at 2pm today to assess patient at bedside.    Expected Discharge Plan: Memory Care Barriers to Discharge: No Barriers Identified  Expected Discharge Plan and Services                                   HH Arranged: PT, OT, RN [facility] Agency: [place] Date [facility] Agency Contacted: 10/21/22 Time [place]: [phone] Representative spoke with at [place]: [place] Determinants of Health ([place]) Interventions SDOH Screenings   Tobacco Use: Medium Risk (10/14/2022)    Readmission Risk Interventions     No data to display

## 2022-10-26 NOTE — ED Notes (Signed)
Patient sitting up in bed eating dinner. Patient awake, alert and appears to be in no acute distress.

## 2022-10-26 NOTE — ED Notes (Signed)
This EDT assisted pt to bathroom. This EDT gave pt a bed bath with the support of Kacey,RN. Pt calm and cooperative.

## 2022-10-26 NOTE — ED Notes (Signed)
VOL TOC placement

## 2022-10-26 NOTE — ED Notes (Signed)
Patient and sitter rounded on. Sitter verbalized that patient just fell asleep and has been trying to sleep for hours. Patient sleeping comfortably in bed with regular, unlabored breathing.

## 2022-10-27 DIAGNOSIS — F03911 Unspecified dementia, unspecified severity, with agitation: Secondary | ICD-10-CM | POA: Diagnosis not present

## 2022-10-27 DIAGNOSIS — S71111A Laceration without foreign body, right thigh, initial encounter: Secondary | ICD-10-CM | POA: Diagnosis not present

## 2022-10-27 DIAGNOSIS — S21112A Laceration without foreign body of left front wall of thorax without penetration into thoracic cavity, initial encounter: Secondary | ICD-10-CM | POA: Diagnosis not present

## 2022-10-27 DIAGNOSIS — F03918 Unspecified dementia, unspecified severity, with other behavioral disturbance: Secondary | ICD-10-CM | POA: Diagnosis not present

## 2022-10-27 MED ORDER — DROPERIDOL 2.5 MG/ML IJ SOLN
2.5000 mg | Freq: Once | INTRAMUSCULAR | Status: AC
  Administered 2022-10-27: 2.5 mg via INTRAMUSCULAR
  Filled 2022-10-27: qty 2

## 2022-10-27 NOTE — ED Notes (Signed)
Mobility present attempting to take patient for a walk around the ER and assess how his mobility is doing.

## 2022-10-27 NOTE — ED Notes (Signed)
Patient ambulated around ER with Rn and ER tech. Patient reported after a lab that he wanted to return to bed. Patient repositioned and given water per request. Patient given warm blankets and is resting in bed with side rails up and sitter at bedside.

## 2022-10-27 NOTE — Progress Notes (Signed)
Mobility Specialist - Progress Note    10/27/22 1259  Mobility  Activity Ambulated with assistance in hallway;Stood at bedside  Level of Assistance Minimal assist, patient does 75% or more  Assistive Device None  Distance Ambulated (ft) 120 ft  Activity Response Tolerated well  Mobility Referral Yes  $Mobility charge 1 Mobility   Pt resting in recliner on RA upon entry. Pt STS with initial assistance by wife/partner and taken over by author to ambulate HHA 2+ (pt refused walker) in hallway around NS CGA. Pt responded to VC to hold NT hand/hold Author hand. Pt returned to room and could not understand/intiate walking past door  or sitting (held onto door when asked to sit). Pt given tactile cueing/manual holding to initiate stand to sit. Pt left in room with needs in reach. Pt NT present in room.   Loma Sender Mobility Specialist 10/27/22, 1:16 PM
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
## 2022-10-27 NOTE — ED Notes (Signed)
Family called and updated on plan of care and patient status per request. Patient resting comfortably in bed with sitter at bedside.

## 2022-10-27 NOTE — ED Notes (Signed)
Patient is resting comfortably. 

## 2022-10-27 NOTE — ED Notes (Signed)
Assisted pt to bathroom. Pt back in bed resting.

## 2022-10-27 NOTE — ED Notes (Signed)
Patient repeatedly attempting to get out of bed. Sitting and RN with patient attempting to redirect him. Patient becoming agitated. Patient is not verbally communicating with ER staff. Patient walked to hall and place his chair next to nurses' station which has seem to help calm the patient.
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
## 2022-10-27 NOTE — ED Notes (Signed)
Attempted to feed pt lunch. Pt took a few sips of drink but refused to eat any food.

## 2022-10-27 NOTE — ED Notes (Signed)
Patient rounded on. Patient is sleeping in bed with sitter at bedside. Patient breathing easy, regular and unlabored.

## 2022-10-27 NOTE — ED Notes (Signed)
Patient sleeping comfortably in bed. Daughter present in ER visiting.

## 2022-10-27 NOTE — ED Notes (Signed)
Patient rounded on. Patient back, resting in bed watching TV. Patient breathing easy, regular and unlabored. Patients calm and relaxed. Patient and sitter denies needs at this time.

## 2022-10-27 NOTE — ED Notes (Signed)
Patient's wife present to visit. Patient up out of bed with assistance. Sitter present in room.

## 2022-10-27 NOTE — ED Provider Notes (Signed)
-----------------------------------------   3:49 AM on 10/27/2022 -----------------------------------------   Blood pressure 127/74, pulse 88, temperature 97.6 F (36.4 C), temperature source Axillary, resp. rate 18, SpO2 100 %.  The patient is calm and cooperative at this time.  There have been no acute events since the last update.  Awaiting disposition plan from case management/social work.    Camri Molloy, Delice Bison, DO 10/27/22 [phone]

## 2022-10-27 NOTE — ED Notes (Signed)
Patient rounded on. Patient awake, resting in bed with side rails up. Patient has sitter at bedside. Patient appears to be in no acute distress.

## 2022-10-27 NOTE — ED Notes (Signed)
Patient awake and visiting with wife. Patient and family denies needs at this time.

## 2022-10-27 NOTE — ED Notes (Signed)
Patient rounded on. Patient resting comfortably in chair, watching TV with sitter at bedside. Patient is calm and appears to be in no acute distress. Patient and sitter denies needs at this time.

## 2022-10-27 NOTE — ED Notes (Signed)
Patient is sitting in chair next to nurses' station with ER tech sitting with him. Patient refused to eat at this time. Patient has not verbally communicated any needs.

## 2022-10-27 NOTE — ED Notes (Signed)
Assisted pt to use the bathroom, pt was unable to use the bathroom after trying for approximately 20 minutes. Pt returned to bed.

## 2022-10-27 NOTE — ED Notes (Signed)
Patient took a bite of mashed potatoes and spit them out.Patient took a few sips of tea, and has been watching TV in his recliner. Patient refused to eat anymore food at this time.

## 2022-10-27 NOTE — ED Notes (Signed)
VOL/TOC placement

## 2022-10-28 NOTE — ED Provider Notes (Signed)
-----------------------------------------   6:51 AM on 10/28/2022 -----------------------------------------   Blood pressure 127/86, pulse 79, temperature 97.8 F (36.6 C), temperature source Axillary, resp. rate 18, SpO2 100 %.  The patient is calm and cooperative at this time.  There have been no acute events since the last update.  Awaiting disposition plan from [facility] team. Stable vitals.   Hinda Kehr, MD 10/28/22 [phone]

## 2022-10-28 NOTE — TOC Progression Note (Addendum)
Transition of Care [facility]) - Progression Note    Patient Details  Name: Tim Barrett MRN: [id number] Date of Birth: October 09, 1940  Transition of Care [facility]) CM/SW [place], [place] Phone Number: 10/28/2022, 11:46 AM  Clinical Narrative:     Confirmed Receipt of referral at [place] of [place] spoke to [place] with Care patrol, reports being unsuccessful in finding placement and home services (PCS) due to patient's mental health hx of suicidal behaviors and self harm attempt.   Danielle with [employer] reports CSW can try to connect with [place] for their memory care unit. That is the final option she can think of.   CSW spoke with Joelene Millin in admissions at [facility] at [phone] she reports fax can be sent to [phone] attn amanade.   CSW has faxed referral pending bed offer.   CSW has made [facility] supervisor informed that patient may be on difficult to place list now.    Expected Discharge Plan: Memory Care Barriers to Discharge: No Barriers Identified  Expected Discharge Plan and Services                                   HH Arranged: PT, OT, RN [facility] Agency: [place] Date [facility] Agency Contacted: 10/21/22 Time [place]: [phone] Representative spoke with at [place]: [place] Determinants of Health ([place]) Interventions SDOH Screenings   Tobacco Use: Medium Risk (10/14/2022)    Readmission Risk Interventions     No data to display

## 2022-10-28 NOTE — ED Notes (Signed)
Attempted to feed pt breakfast. Pt refused.

## 2022-10-28 NOTE — ED Notes (Signed)
Report given to Andrew RN

## 2022-10-28 NOTE — ED Notes (Signed)
This tech attempted to get pt to eat and drink fluids for dinner this evening. Pt accepted a few sips of tea and sucked on a bite of ice cream before pushing tray away and refusing to eat.

## 2022-10-28 NOTE — ED Notes (Signed)
Pt bed linens changes at this time. Gown changed at this time.

## 2022-10-28 NOTE — ED Notes (Signed)
Pt awake and in bed, seems to be restless.

## 2022-10-28 NOTE — ED Notes (Signed)
Patient is resting comfortably. 

## 2022-10-28 NOTE — ED Notes (Signed)
This tech and tech Gabby transferred pt to the recliner thinking it might be a good change of position for pt. Pt was agitated and not wanting to stay in recliner. Pt was transferred back to bed. Attempted to feed pt applesauce and cranberry juice to drink. Pt refused both.

## 2022-10-28 NOTE — ED Notes (Signed)
Pt attempting to get out of bed. NT Charitee verbally redirected client back to lying position in bed to avoid any falls. Pt pushed NT Charitee. NT asked for assistance getting pt back in bed. NT Abigail and EDT Caitlyn assisted with getting patient back to bed. Pt sitting in recliner at bedside.

## 2022-10-28 NOTE — ED Notes (Signed)
This tech is now sitting with pt. Report received from previous tech. PT is calm, in bed watching tv, and expressing no needs ar this time.

## 2022-10-28 NOTE — ED Notes (Signed)
BM attempt unsuccessful. Pt back in bed, brief changed.

## 2022-10-28 NOTE — ED Notes (Signed)
VOL pending TOC

## 2022-10-28 NOTE — ED Notes (Addendum)
Daughter to bedside. Pt drank a couple sips of water for her. Transferred pt to the recliner. Daughter left and wife is now at bedside visiting.

## 2022-10-28 NOTE — ED Notes (Signed)
Pt back in bed.

## 2022-10-28 NOTE — ED Notes (Signed)
Pt pointed to the toilet. When asked if pt needed to go to the restroom pt said yes. This tech was able to get pt to stand in the attempt to use the urinal. Pt did not use the restroom. But Tech did change pt's brief, straightened up the bed. Pt is woke, listening to music with tech at bedside.

## 2022-10-28 NOTE — ED Notes (Signed)
Patient is resting comfortably back in bed.

## 2022-10-28 NOTE — ED Notes (Addendum)
Pt using the bathroom. Attempting to have BM.

## 2022-10-28 NOTE — ED Notes (Signed)
Pt up and moved to recliner.

## 2022-10-28 NOTE — ED Notes (Signed)
This tech gave report to oncoming tech. This tech is no longer sitting with pt

## 2022-10-28 NOTE — ED Notes (Signed)
Pt back to bed. Pt was agitated in recliner.

## 2022-10-28 NOTE — ED Notes (Signed)
Pt out of recliner using bathroom.

## 2022-10-28 NOTE — ED Notes (Signed)
Pt restless. Pt refusing to take medication. Attempted to give with ice cream. Pt would not open mouth. Pt not following commands. Getting up out of bed. Tech and RN got pt back in bed. Pt pulling at gown.

## 2022-10-28 NOTE — ED Notes (Signed)
Pt very restless in bed at this time. When asked what is wrong pt is unable to express himself and continues to appear restless.

## 2022-10-28 NOTE — ED Notes (Signed)
Pt had been antsy for the last 30 minutes. Physical Therapy came and tried to get pt to walk out of the room. Pt would take a few steps and then sit down. Tech and physical therapist finally were able to get pt to sit down on the recliner so tech could change pt's brief. Pt started to become agitated so once we got him situated we both backed away from pt. Pt is sitting in the recliner with a blanket on his lap watching TV with tech at bedside.

## 2022-10-28 NOTE — ED Notes (Signed)
Pt asleep at this time

## 2022-10-28 NOTE — ED Notes (Signed)
Pt up to recliner at this time. Still restless but unable to communicate needs.

## 2022-10-28 NOTE — ED Notes (Signed)
RN and tech did a complete linen and gown change. New brief placed on pt. Pt was able to roll and assist with movement. PT cooperative.

## 2022-10-28 NOTE — NC FL2 (Signed)
[place] LEVEL OF CARE FORM     IDENTIFICATION  Patient Name: Tim Barrett Birthdate: July 26, 1941 Sex: male Admission Date (Current Location): 10/14/2022  [place] and [place] Number:  Engineering geologist and Address:  [facility], [address], [place], [place] [address]      Provider Number: [id number]  Attending Physician Name and Address:  No att. providers found  Relative Name and Phone Number:  [name] (spouse) [phone]    Current Level of Care: Hospital Recommended Level of Care: [place] Prior Approval Number:    Date Approved/Denied:   PASRR Number: [id number]  Discharge Plan: SNF    Current Diagnoses: Patient Active Problem List   Diagnosis Date Noted   Dementia with behavioral disturbance ([place]) 10/15/2022   Hypercholesterolemia 01/20/2014   Peptic ulcer disease 01/20/2014    Orientation RESPIRATION BLADDER Height & Weight     Self  Normal Incontinent, External catheter Weight:   Height:     BEHAVIORAL SYMPTOMS/MOOD NEUROLOGICAL BOWEL NUTRITION STATUS      Continent Diet (see dc summary)  AMBULATORY STATUS COMMUNICATION OF NEEDS Skin   Limited Assist Verbally Normal                       Personal Care Assistance Level of Assistance  Bathing, Feeding, Dressing, Total care Bathing Assistance: Limited assistance Feeding assistance: Limited assistance Dressing Assistance: Limited assistance Total Care Assistance: Limited assistance   Functional Limitations Info  Hearing, Speech, Sight Sight Info: Adequate Hearing Info: Adequate Speech Info: Adequate    SPECIAL CARE FACTORS FREQUENCY                       Contractures Contractures Info: Not present    Additional Factors Info  Code Status, Allergies Code Status Info: DNR Allergies Info: no known allergies           Current Medications (10/28/2022):  This is the current hospital active medication list Current  Facility-Administered Medications  Medication Dose Route Frequency Provider Last Rate Last Admin   alfuzosin (UROXATRAL) 24 hr tablet 10 mg  10 mg Oral Q breakfast Patrecia Pour, NP   10 mg at 10/26/22 [id number]   aspirin chewable tablet 81 mg  81 mg Oral Daily Blake Divine, MD   81 mg at 10/28/22 [id number]   divalproex (DEPAKOTE SPRINKLE) capsule 250 mg  250 mg Oral Q12H Blake Divine, MD   250 mg at 10/28/22 0920   donepezil (ARICEPT) tablet 5 mg  5 mg Oral QHS Patrecia Pour, NP   5 mg at 10/25/22 2211   pantoprazole (PROTONIX) injection 40 mg  40 mg Intravenous Daily Arta Silence, MD   40 mg at 10/25/22 0901   risperiDONE (RISPERDAL) tablet 1 mg  1 mg Oral BID PRN Patrecia Pour, NP   1 mg at 10/24/22 1537   Current Outpatient Medications  Medication Sig Dispense Refill   aspirin EC 81 MG tablet Take 81 mg by mouth daily.     divalproex (DEPAKOTE) 250 MG DR tablet Take 250 mg by mouth at bedtime.     donepezil (ARICEPT) 5 MG tablet Take 1 tablet by mouth at bedtime.     esomeprazole (NEXIUM) 20 MG capsule Take by mouth.     alfuzosin (UROXATRAL) 10 MG 24 hr tablet Take 1 tablet (10 mg total) by mouth daily with breakfast. (Patient not taking: Reported on 10/15/2022) 30 tablet 11  Discharge Medications: Please see discharge summary for a list of discharge medications.  Relevant Imaging Results:  Relevant Lab Results:   Additional Information SSN: [id number]  Tiburcio Bash, LCSW
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
## 2022-10-28 NOTE — ED Notes (Signed)
Vol / pending TOC placement 

## 2022-10-28 NOTE — ED Notes (Signed)
Pt refused main portion of lunch.Tech was able to get him to eat two spoonfuls of ice cream and a sip of tea. When tech offered pt more, pt turned away from tech. Pt is woke looking at TV with Tech at bedside.

## 2022-10-29 DIAGNOSIS — K279 Peptic ulcer, site unspecified, unspecified as acute or chronic, without hemorrhage or perforation: Secondary | ICD-10-CM | POA: Diagnosis present

## 2022-10-29 DIAGNOSIS — Z79899 Other long term (current) drug therapy: Secondary | ICD-10-CM | POA: Diagnosis not present

## 2022-10-29 DIAGNOSIS — I700 Atherosclerosis of aorta: Secondary | ICD-10-CM | POA: Diagnosis present

## 2022-10-29 DIAGNOSIS — Z7189 Other specified counseling: Secondary | ICD-10-CM | POA: Diagnosis not present

## 2022-10-29 DIAGNOSIS — E870 Hyperosmolality and hypernatremia: Secondary | ICD-10-CM | POA: Diagnosis not present

## 2022-10-29 DIAGNOSIS — Z515 Encounter for palliative care: Secondary | ICD-10-CM | POA: Diagnosis not present

## 2022-10-29 DIAGNOSIS — N400 Enlarged prostate without lower urinary tract symptoms: Secondary | ICD-10-CM | POA: Diagnosis present

## 2022-10-29 DIAGNOSIS — S51812A Laceration without foreign body of left forearm, initial encounter: Secondary | ICD-10-CM | POA: Diagnosis present

## 2022-10-29 DIAGNOSIS — Z882 Allergy status to sulfonamides status: Secondary | ICD-10-CM | POA: Diagnosis not present

## 2022-10-29 DIAGNOSIS — E860 Dehydration: Secondary | ICD-10-CM | POA: Diagnosis present

## 2022-10-29 DIAGNOSIS — E876 Hypokalemia: Secondary | ICD-10-CM | POA: Diagnosis present

## 2022-10-29 DIAGNOSIS — K219 Gastro-esophageal reflux disease without esophagitis: Secondary | ICD-10-CM | POA: Diagnosis present

## 2022-10-29 DIAGNOSIS — N179 Acute kidney failure, unspecified: Secondary | ICD-10-CM | POA: Diagnosis not present

## 2022-10-29 DIAGNOSIS — Z66 Do not resuscitate: Secondary | ICD-10-CM | POA: Diagnosis present

## 2022-10-29 DIAGNOSIS — S21112A Laceration without foreign body of left front wall of thorax without penetration into thoracic cavity, initial encounter: Secondary | ICD-10-CM | POA: Diagnosis present

## 2022-10-29 DIAGNOSIS — Z8249 Family history of ischemic heart disease and other diseases of the circulatory system: Secondary | ICD-10-CM | POA: Diagnosis not present

## 2022-10-29 DIAGNOSIS — F03911 Unspecified dementia, unspecified severity, with agitation: Secondary | ICD-10-CM | POA: Diagnosis present

## 2022-10-29 DIAGNOSIS — D72829 Elevated white blood cell count, unspecified: Secondary | ICD-10-CM | POA: Diagnosis present

## 2022-10-29 DIAGNOSIS — I2510 Atherosclerotic heart disease of native coronary artery without angina pectoris: Secondary | ICD-10-CM | POA: Diagnosis present

## 2022-10-29 DIAGNOSIS — X789XXA Intentional self-harm by unspecified sharp object, initial encounter: Secondary | ICD-10-CM | POA: Diagnosis present

## 2022-10-29 DIAGNOSIS — Z87891 Personal history of nicotine dependence: Secondary | ICD-10-CM | POA: Diagnosis not present

## 2022-10-29 DIAGNOSIS — T1491XA Suicide attempt, initial encounter: Secondary | ICD-10-CM | POA: Diagnosis not present

## 2022-10-29 DIAGNOSIS — Z1152 Encounter for screening for COVID-19: Secondary | ICD-10-CM | POA: Diagnosis not present

## 2022-10-29 DIAGNOSIS — E7800 Pure hypercholesterolemia, unspecified: Secondary | ICD-10-CM

## 2022-10-29 DIAGNOSIS — F0394 Unspecified dementia, unspecified severity, with anxiety: Secondary | ICD-10-CM | POA: Diagnosis present

## 2022-10-29 DIAGNOSIS — F03918 Unspecified dementia, unspecified severity, with other behavioral disturbance: Secondary | ICD-10-CM | POA: Diagnosis not present

## 2022-10-29 DIAGNOSIS — Z23 Encounter for immunization: Secondary | ICD-10-CM | POA: Diagnosis present

## 2022-10-29 DIAGNOSIS — E785 Hyperlipidemia, unspecified: Secondary | ICD-10-CM | POA: Diagnosis present

## 2022-10-29 LAB — CBC WITH DIFFERENTIAL/PLATELET
Abs Immature Granulocytes: 0.09 10*3/uL — ABNORMAL HIGH (ref 0.00–0.07)
Basophils Absolute: 0.1 10*3/uL (ref 0.0–0.1)
Basophils Relative: 1 %
Eosinophils Absolute: 0 10*3/uL (ref 0.0–0.5)
Eosinophils Relative: 0 %
HCT: 49.6 % (ref 39.0–52.0)
Hemoglobin: 16 g/dL (ref 13.0–17.0)
Immature Granulocytes: 1 %
Lymphocytes Relative: 13 %
Lymphs Abs: 1.6 10*3/uL (ref 0.7–4.0)
MCH: 31.6 pg (ref 26.0–34.0)
MCHC: 32.3 g/dL (ref 30.0–36.0)
MCV: 97.8 fL (ref 80.0–100.0)
Monocytes Absolute: 1.2 10*3/uL — ABNORMAL HIGH (ref 0.1–1.0)
Monocytes Relative: 10 %
Neutro Abs: 9.4 10*3/uL — ABNORMAL HIGH (ref 1.7–7.7)
Neutrophils Relative %: 75 %
Platelets: 208 10*3/uL (ref 150–400)
RBC: 5.07 MIL/uL (ref 4.22–5.81)
RDW: 13.1 % (ref 11.5–15.5)
WBC: 12.4 10*3/uL — ABNORMAL HIGH (ref 4.0–10.5)
nRBC: 0 % (ref 0.0–0.2)

## 2022-10-29 LAB — BASIC METABOLIC PANEL
Anion gap: 16 — ABNORMAL HIGH (ref 5–15)
Anion gap: 11 (ref 5–15)
Anion gap: 3 — ABNORMAL LOW (ref 5–15)
BUN: 67 mg/dL — ABNORMAL HIGH (ref 8–23)
BUN: 72 mg/dL — ABNORMAL HIGH (ref 8–23)
BUN: 62 mg/dL — ABNORMAL HIGH (ref 8–23)
CO2: 21 mmol/L — ABNORMAL LOW (ref 22–32)
CO2: 27 mmol/L (ref 22–32)
CO2: 24 mmol/L (ref 22–32)
Calcium: 10 mg/dL (ref 8.9–10.3)
Calcium: 10.1 mg/dL (ref 8.9–10.3)
Calcium: 9.2 mg/dL (ref 8.9–10.3)
Chloride: 120 mmol/L — ABNORMAL HIGH (ref 98–111)
Chloride: 121 mmol/L — ABNORMAL HIGH (ref 98–111)
Chloride: 125 mmol/L — ABNORMAL HIGH (ref 98–111)
Creatinine, Ser: 1.7 mg/dL — ABNORMAL HIGH (ref 0.61–1.24)
Creatinine, Ser: 1.57 mg/dL — ABNORMAL HIGH (ref 0.61–1.24)
Creatinine, Ser: 1.32 mg/dL — ABNORMAL HIGH (ref 0.61–1.24)
GFR, Estimated: 40 mL/min — ABNORMAL LOW (ref >60)
GFR, Estimated: 44 mL/min — ABNORMAL LOW (ref >60)
GFR, Estimated: 54 mL/min — ABNORMAL LOW (ref >60)
Glucose, Bld: 131 mg/dL — ABNORMAL HIGH (ref 70–99)
Glucose, Bld: 120 mg/dL — ABNORMAL HIGH (ref 70–99)
Glucose, Bld: 141 mg/dL — ABNORMAL HIGH (ref 70–99)
Potassium: 3.4 mmol/L — ABNORMAL LOW (ref 3.5–5.1)
Potassium: 3.9 mmol/L (ref 3.5–5.1)
Potassium: 3.7 mmol/L (ref 3.5–5.1)
Sodium: 157 mmol/L — ABNORMAL HIGH (ref 135–145)
Sodium: 159 mmol/L — ABNORMAL HIGH (ref 135–145)
Sodium: 152 mmol/L — ABNORMAL HIGH (ref 135–145)

## 2022-10-29 LAB — MAGNESIUM: Magnesium: 3.3 mg/dL — ABNORMAL HIGH (ref 1.7–2.4)

## 2022-10-29 LAB — OSMOLALITY: Osmolality: 357 mOsm/kg (ref 275–295)

## 2022-10-29 LAB — PHOSPHORUS: Phosphorus: 4.7 mg/dL — ABNORMAL HIGH (ref 2.5–4.6)

## 2022-10-29 MED ORDER — DEXTROSE-NACL 5-0.45 % IV SOLN: INTRAVENOUS | Status: DC

## 2022-10-29 MED ORDER — VALPROATE SODIUM 100 MG/ML IV SOLN
250.0000 mg | Freq: Two times a day (BID) | INTRAVENOUS | Status: DC
  Administered 2022-10-29 – 2022-11-02 (×8): 250 mg via INTRAVENOUS
  Filled 2022-10-29 (×11): qty 2.5

## 2022-10-29 MED ORDER — POTASSIUM CHLORIDE 20 MEQ PO PACK: 40.0000 meq | PACK | Freq: Once | ORAL | Status: DC

## 2022-10-29 MED ORDER — HALOPERIDOL LACTATE 5 MG/ML IJ SOLN
3.0000 mg | Freq: Once | INTRAMUSCULAR | Status: AC
  Administered 2022-10-29: 3 mg via INTRAVENOUS
  Filled 2022-10-29: qty 1

## 2022-10-29 MED ORDER — ENOXAPARIN SODIUM 40 MG/0.4ML IJ SOSY
40.0000 mg | PREFILLED_SYRINGE | INTRAMUSCULAR | Status: DC
  Administered 2022-10-29 – 2022-10-30 (×2): 40 mg via SUBCUTANEOUS
  Filled 2022-10-29 (×2): qty 0.4

## 2022-10-29 MED ORDER — DROPERIDOL 2.5 MG/ML IJ SOLN
2.5000 mg | Freq: Once | INTRAMUSCULAR | Status: DC
  Filled 2022-10-29: qty 2

## 2022-10-29 MED ORDER — DEXTROSE-NACL 5-0.9 % IV SOLN: INTRAVENOUS | Status: DC

## 2022-10-29 MED ORDER — PANTOPRAZOLE SODIUM 40 MG PO TBEC
40.0000 mg | DELAYED_RELEASE_TABLET | Freq: Every day | ORAL | Status: DC
  Filled 2022-10-29 (×3): qty 1

## 2022-10-29 MED ORDER — DROPERIDOL 2.5 MG/ML IJ SOLN
5.0000 mg | Freq: Once | INTRAMUSCULAR | Status: AC
  Administered 2022-10-29: 5 mg via INTRAMUSCULAR

## 2022-10-29 MED ORDER — SODIUM CHLORIDE 0.9 % IV BOLUS
1000.0000 mL | Freq: Once | INTRAVENOUS | Status: AC
  Administered 2022-10-29: 1000 mL via INTRAVENOUS

## 2022-10-29 NOTE — ED Notes (Signed)
Pt lying in bed with eyes open. Noted bilateral chest rise and fall. Pt appears comfortable and no longer restless.

## 2022-10-29 NOTE — ED Notes (Signed)
Pt asleep at this time

## 2022-10-29 NOTE — ED Provider Notes (Addendum)
Emergency Medicine Observation Re-evaluation Note  Physical Exam   BP 116/73   Pulse 97   Temp 98.1 F (36.7 C) (Oral)   Resp 18   Wt 58.2 kg   SpO2 100%   BMI 17.90 kg/m   Patient appears in no acute distress.  Cachectic, thin, chronically ill-appearing, abdomen soft and nontender skin appears warm well-perfused.  ED Course / MDM   He had agitation this morning, continues to refuse to eat food, most recent labs reviewed electrolytes within normal limits, was becoming violent with staff this morning, required IM sedation.  Check EKG and repeat labs in the setting of poor p.o. intake over the last week.  Pt is awaiting dispo from SW   -- 11:39 AM Labs have resulted and he is hyponatremic, with an AKI. I will order him for some IV crystalloid bolus as well as a D5 normal saline infusion, I believe his electrolyte disturbances are as result of his refusing to eat.  I will admit him and palliative care will continue to consult and he will need his nutritional deficiencies addressed with long-term solution while inpatient.  Lucillie Garfinkel MD    Lucillie Garfinkel, MD 10/29/22 [id number]    Lucillie Garfinkel, MD 10/29/22 [place], MD 10/29/22 [phone]

## 2022-10-29 NOTE — ED Notes (Signed)
Vol/pending toc placement

## 2022-10-29 NOTE — ED Notes (Signed)
Daughter Hal Hope and wife [name] updated.

## 2022-10-29 NOTE — ED Notes (Signed)
Pt safety restraints for upper extremities taken off. Pt laying in bed resting

## 2022-10-29 NOTE — ED Notes (Signed)
Pt attempting to stand up out of bed. Pt assisted with standing, pt walked to the other side of the bed and laid back in the bed. Pt took brief off.
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
## 2022-10-29 NOTE — ED Notes (Signed)
Pt taken off safety restraints. Pt resting in bed calmly. Sitter at bedside. IV attempted and unsuccessful by this RN
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
## 2022-10-29 NOTE — ED Notes (Signed)
Patient is vol pending toc placement 

## 2022-10-29 NOTE — ED Notes (Signed)
Pt's wife at bedside. This RN swabbed pt's mouth with sponge and gave pt water. Right arm soft restraint removed. Pt is alert and calm.

## 2022-10-29 NOTE — ED Notes (Signed)
Pt belongings returned from [place]. [employer] transporting pt to inpatient floor.

## 2022-10-29 NOTE — ED Notes (Signed)
Advised nurse that patient has ready bed 

## 2022-10-29 NOTE — ED Notes (Addendum)
Wife updated as to patient's status. Agreeable to plan of care
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
## 2022-10-29 NOTE — ED Notes (Signed)
This RN attempted to call Tim Barrett and left message to call this RN back. NT obtaining EKG.

## 2022-10-29 NOTE — ED Notes (Signed)
Pt lying in bed with eyes open. Pt appears restless and not able to lay still. Pt occasionally moaning and groaning.

## 2022-10-30 DIAGNOSIS — F03918 Unspecified dementia, unspecified severity, with other behavioral disturbance: Secondary | ICD-10-CM | POA: Diagnosis not present

## 2022-10-30 LAB — BASIC METABOLIC PANEL
Anion gap: 7 (ref 5–15)
Anion gap: 8 (ref 5–15)
BUN: 53 mg/dL — ABNORMAL HIGH (ref 8–23)
BUN: 53 mg/dL — ABNORMAL HIGH (ref 8–23)
CO2: 27 mmol/L (ref 22–32)
CO2: 25 mmol/L (ref 22–32)
Calcium: 9.5 mg/dL (ref 8.9–10.3)
Calcium: 9.2 mg/dL (ref 8.9–10.3)
Chloride: 125 mmol/L — ABNORMAL HIGH (ref 98–111)
Chloride: 126 mmol/L — ABNORMAL HIGH (ref 98–111)
Creatinine, Ser: 1.19 mg/dL (ref 0.61–1.24)
Creatinine, Ser: 1.29 mg/dL — ABNORMAL HIGH (ref 0.61–1.24)
GFR, Estimated: >60 mL/min (ref >60)
GFR, Estimated: 56 mL/min — ABNORMAL LOW (ref >60)
Glucose, Bld: 140 mg/dL — ABNORMAL HIGH (ref 70–99)
Glucose, Bld: 137 mg/dL — ABNORMAL HIGH (ref 70–99)
Potassium: 4.2 mmol/L (ref 3.5–5.1)
Potassium: 3.8 mmol/L (ref 3.5–5.1)
Sodium: 159 mmol/L — ABNORMAL HIGH (ref 135–145)
Sodium: 159 mmol/L — ABNORMAL HIGH (ref 135–145)

## 2022-10-30 LAB — CBC
HCT: 46.9 % (ref 39.0–52.0)
Hemoglobin: 14.9 g/dL (ref 13.0–17.0)
MCH: 30.9 pg (ref 26.0–34.0)
MCHC: 31.8 g/dL (ref 30.0–36.0)
MCV: 97.3 fL (ref 80.0–100.0)
Platelets: 172 10*3/uL (ref 150–400)
RBC: 4.82 MIL/uL (ref 4.22–5.81)
RDW: 13.3 % (ref 11.5–15.5)
WBC: 14 10*3/uL — ABNORMAL HIGH (ref 4.0–10.5)
nRBC: 0 % (ref 0.0–0.2)

## 2022-10-30 LAB — GLUCOSE, CAPILLARY: Glucose-Capillary: 127 mg/dL — ABNORMAL HIGH (ref 70–99)

## 2022-10-30 MED ORDER — LORAZEPAM 2 MG/ML IJ SOLN
1.0000 mg | Freq: Four times a day (QID) | INTRAMUSCULAR | Status: DC | PRN
  Administered 2022-10-30: 1 mg via INTRAVENOUS
  Administered 2022-10-30: 2 mg via INTRAVENOUS
  Administered 2022-11-01: 1 mg via INTRAVENOUS
  Filled 2022-10-30 (×2): qty 1

## 2022-10-30 MED ORDER — HALOPERIDOL LACTATE 5 MG/ML IJ SOLN: 5.0000 mg | Freq: Four times a day (QID) | INTRAMUSCULAR | Status: DC | PRN

## 2022-10-30 MED ORDER — LORAZEPAM 2 MG/ML IJ SOLN: 2.0000 mg | Freq: Four times a day (QID) | INTRAMUSCULAR | Status: DC | PRN

## 2022-10-30 MED ORDER — LORAZEPAM 2 MG/ML IJ SOLN
INTRAMUSCULAR | Status: AC
  Filled 2022-10-30: qty 1
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
## 2022-10-31 DIAGNOSIS — E870 Hyperosmolality and hypernatremia: Secondary | ICD-10-CM | POA: Diagnosis not present

## 2022-10-31 DIAGNOSIS — F03911 Unspecified dementia, unspecified severity, with agitation: Secondary | ICD-10-CM | POA: Diagnosis not present

## 2022-10-31 DIAGNOSIS — Z7189 Other specified counseling: Secondary | ICD-10-CM

## 2022-10-31 DIAGNOSIS — N179 Acute kidney failure, unspecified: Secondary | ICD-10-CM | POA: Diagnosis not present

## 2022-10-31 DIAGNOSIS — F03918 Unspecified dementia, unspecified severity, with other behavioral disturbance: Secondary | ICD-10-CM | POA: Diagnosis not present

## 2022-10-31 LAB — GLUCOSE, CAPILLARY: Glucose-Capillary: 128 mg/dL — ABNORMAL HIGH (ref 70–99)

## 2022-10-31 LAB — BASIC METABOLIC PANEL
Anion gap: 6 (ref 5–15)
BUN: 35 mg/dL — ABNORMAL HIGH (ref 8–23)
CO2: 25 mmol/L (ref 22–32)
Calcium: 9.1 mg/dL (ref 8.9–10.3)
Chloride: 125 mmol/L — ABNORMAL HIGH (ref 98–111)
Creatinine, Ser: 1.04 mg/dL (ref 0.61–1.24)
GFR, Estimated: >60 mL/min (ref >60)
Glucose, Bld: 148 mg/dL — ABNORMAL HIGH (ref 70–99)
Potassium: 3.6 mmol/L (ref 3.5–5.1)
Sodium: 156 mmol/L — ABNORMAL HIGH (ref 135–145)

## 2022-10-31 MED ORDER — MORPHINE SULFATE (PF) 2 MG/ML IV SOLN
2.0000 mg | INTRAVENOUS | Status: DC | PRN
  Administered 2022-11-01 (×2): 2 mg via INTRAVENOUS
  Filled 2022-10-31 (×2): qty 1

## 2022-10-31 MED ORDER — POLYVINYL ALCOHOL 1.4 % OP SOLN: 1.0000 [drp] | Freq: Four times a day (QID) | OPHTHALMIC | Status: DC | PRN

## 2022-10-31 MED ORDER — ACETAMINOPHEN 325 MG PO TABS: 650.0000 mg | ORAL_TABLET | Freq: Four times a day (QID) | ORAL | Status: DC | PRN

## 2022-10-31 MED ORDER — GLYCOPYRROLATE 0.2 MG/ML IJ SOLN: 0.2000 mg | INTRAMUSCULAR | Status: DC | PRN

## 2022-10-31 MED ORDER — ACETAMINOPHEN 650 MG RE SUPP: 650.0000 mg | Freq: Four times a day (QID) | RECTAL | Status: DC | PRN

## 2022-10-31 MED ORDER — PROCHLORPERAZINE EDISYLATE 10 MG/2ML IJ SOLN: 10.0000 mg | Freq: Four times a day (QID) | INTRAMUSCULAR | Status: DC | PRN

## 2022-10-31 MED ORDER — SODIUM CHLORIDE 0.9 % IV SOLN: INTRAVENOUS | Status: DC

## 2022-10-31 MED ORDER — PROCHLORPERAZINE 25 MG RE SUPP: 25.0000 mg | Freq: Two times a day (BID) | RECTAL | Status: DC | PRN

## 2022-10-31 MED ORDER — GLYCOPYRROLATE 1 MG PO TABS: 1.0000 mg | ORAL_TABLET | ORAL | Status: DC | PRN

## 2022-10-31 MED ORDER — GLYCOPYRROLATE 0.2 MG/ML IJ SOLN
0.2000 mg | INTRAMUSCULAR | Status: DC | PRN
  Administered 2022-11-01: 0.2 mg via INTRAVENOUS
  Filled 2022-10-31 (×2): qty 1

## 2022-10-31 NOTE — IPAL (Signed)
  Interdisciplinary Goals of Care Family Meeting   Date carried out: 10/31/2022  Location of the meeting: Bedside  Member's involved: Physician, Family Member or next of kin, Palliative care team member, and Other: patient's sitter  Durable Power of Attorney or acting medical decision maker: Wife and daughters present    Discussion: We discussed goals of care for HADYN BLANCK. At this point, end stage dementia with significant mental distress and agitation, not eating, electrolyte derangements. We discussed option to continue to treat the treatable but since he has not been eating he is likely going to end up in the same medical state he is in now. 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 Family would like to make him comfortable and stop aggressive treatment. Comfort measures orders placed and explained to the family, all questions answered   Code status:   Code Status: DNR   Disposition: In-patient comfort care, possible transfer to hospice facility   Time spent for the meeting: 30 min    Emeterio Reeve, DO  10/31/2022, 12:55 PM
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
## 2022-10-31 NOTE — Plan of Care (Signed)
  Problem: Safety: Goal: Non-violent Restraint(s) Outcome: Progressing   Problem: Coping: Goal: Level of anxiety will decrease Outcome: Progressing   Problem: Safety: Goal: Ability to remain free from injury will improve Outcome: Progressing   Problem: Skin Integrity: Goal: Risk for impaired skin integrity will decrease Outcome: Progressing   Problem: Pain Management: Goal: Satisfaction with pain management regimen will improve Outcome: Progressing
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
## 2022-10-31 NOTE — TOC Progression Note (Signed)
Transition of Care [facility]) - Progression Note    Patient Details  Name: Tim Barrett MRN: [id number] Date of Birth: [birth year]  Transition of Care [facility]) CM/SW [place], RN Phone Number: 10/31/2022, 1:34 PM  Clinical Narrative:    Reached out to [facility], spoke with Estill Bamberg in [place] they did receive the referral looking for a bed in Memory care They are not able to accept the patient due to behaviors   Expected Discharge Plan: Memory Care Barriers to Discharge: No Barriers Identified  Expected Discharge Plan and Services                                   HH Arranged: PT, OT, RN [facility] Agency: [place] Date [facility] Agency Contacted: 10/21/22 Time [place]: [id number] Representative spoke with at [place]: [place] ([place]) Interventions SDOH Screenings   Tobacco Use: Medium Risk (10/14/2022)    Readmission Risk Interventions     No data to display

## 2022-11-01 DIAGNOSIS — N179 Acute kidney failure, unspecified: Secondary | ICD-10-CM | POA: Diagnosis not present

## 2022-11-01 DIAGNOSIS — Z515 Encounter for palliative care: Secondary | ICD-10-CM | POA: Diagnosis not present

## 2022-11-01 DIAGNOSIS — E870 Hyperosmolality and hypernatremia: Secondary | ICD-10-CM | POA: Diagnosis not present

## 2022-11-01 DIAGNOSIS — F03918 Unspecified dementia, unspecified severity, with other behavioral disturbance: Secondary | ICD-10-CM | POA: Diagnosis not present
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
## 2022-11-01 NOTE — TOC Progression Note (Signed)
Transition of Care [facility]) - Progression Note    Patient Details  Name: Tim Barrett MRN: [id number] Date of Birth: 01-Mar-1941  Transition of Care [facility]) CM/SW [place], RN Phone Number: 11/01/2022, 8:49 AM  Clinical Narrative:    Spoke with the patients daughter Tim Barrett, she stated that wanted [place], I reached out to  [place] with [facility] They will assess the patient, she stated that they do not have a bed at the moment but they will reach out  Expected Discharge Plan: Memory Care Barriers to Discharge: No Barriers Identified  Expected Discharge Plan and Services                                   HH Arranged: PT, OT, RN [facility] Agency: [place] Date [place]: 10/21/22 Time [place]: [id number] Representative spoke with at [place]: [place] Determinants of Health ([place]) Interventions SDOH Screenings   Tobacco Use: Medium Risk (10/14/2022)    Readmission Risk Interventions     No data to display

## 2022-11-01 NOTE — Care Management Important Message (Signed)
Important Message  Patient Details  Name: Tim Barrett MRN: [id number] Date of Birth: [birth year]   Medicare Important Message Given:  Other (see comment)  On comfort care.  Medicare IM withheld at this time out of respect for patient and family.    Dannette Barbara 11/01/2022, 8:40 AM

## 2022-11-01 NOTE — TOC Progression Note (Addendum)
Transition of Care [facility]) - Progression Note    Patient Details  Name: Tim Barrett MRN: [id number] Date of Birth: Feb 18, 1941  Transition of Care [facility]) CM/SW [place], [place] Phone Number: 11/01/2022, 4:35 PM  Clinical Narrative:     Update 4:42 pm: Per Lorayne Bender with [facility] family will tour facility tomorrow and complete paperwork, anticipate patient transferring to [facility] tomorrow.     [name] patient's daughter called this CSW as she reports having questions regarding discharge planning, has talked to [facility] but reports she is interested in knowing if patient can stay in the hospital.   CSW explained we typically allow patients to stay if imminent death or if transporting to hospice facility could be harmful to patient.   [name] is interested in finding out if patient was approved for hospice home, reports she is pending acceptance at this time. She states they may be interested in [place] facility prior to making decision.   All questions and concerns answered at this time.   Expected Discharge Plan: Memory Care Barriers to Discharge: No Barriers Identified  Expected Discharge Plan and Services                                   HH Arranged: PT, OT, RN [facility] Agency: [place] Date [facility] Agency Contacted: 10/21/22 Time [place]: [phone] Representative spoke with at [place]: [place] Determinants of Health ([place]) Interventions SDOH Screenings   Tobacco Use: Medium Risk (10/14/2022)    Readmission Risk Interventions     No data to display

## 2022-11-01 NOTE — Progress Notes (Addendum)
Manufacturing engineer [facility]) Hospital Liaison Note  Received request from Transitions of Care Manager Deliliah L., RN for family interest in [place]. Additionally, MSW spoke with PMT provider/Kathryn S., NP length regarding patients decline & comfort needs.   Visited patient at bedside and awaiting response from family to provide hospice education, consent to proceed with evaluation & address any additional questions they may have. Family was not present at bedside during MSW assessment. Per [facility], [facility] IDT, patient spouse does not have her cell phone but encouraged MSW to contact daughter/[name]. She has been contacted & VM left. Awaiting response.  Approval for Hospice Home is determined by [facility] MD. Once [facility] MD has determined Hospice Home eligibility, and family agrees for evaluation, [facility] will update hospital staff and family.  Addendum 3:22 pm-- MSW contacted [name] via TC to confirm that previous VM received. [name] very apologetic reporting that she intended to contact MSW and is appreciative of second attempt. Conference call held w/ daughter & spouse. Patient currently under review by MD.   Addendum: Patient has been approved for [facility]. Goal is for DC on 3.13 following family touring the facility & consent completion.   Please do not hesitate to call with any hospice related questions.    Thank you for the opportunity to participate in this patient's care.  Phillis Haggis, MSW [place] Hospital Liaison  [phone]
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
## 2022-11-02 DIAGNOSIS — K279 Peptic ulcer, site unspecified, unspecified as acute or chronic, without hemorrhage or perforation: Secondary | ICD-10-CM | POA: Diagnosis not present

## 2022-11-02 DIAGNOSIS — F03918 Unspecified dementia, unspecified severity, with other behavioral disturbance: Secondary | ICD-10-CM | POA: Diagnosis not present

## 2022-11-02 DIAGNOSIS — E870 Hyperosmolality and hypernatremia: Secondary | ICD-10-CM | POA: Diagnosis not present

## 2022-11-02 DIAGNOSIS — Z515 Encounter for palliative care: Secondary | ICD-10-CM | POA: Diagnosis not present

## 2022-11-02 MED ORDER — MORPHINE 100MG IN NS 100ML (1MG/ML) PREMIX INFUSION: 1.0000 mg/h | INTRAVENOUS | 0 refills | Status: DC

## 2022-11-02 MED ORDER — MORPHINE 100MG IN NS 100ML (1MG/ML) PREMIX INFUSION
1.0000 mg/h | INTRAVENOUS | Status: DC
  Administered 2022-11-02: 1 mg/h via INTRAVENOUS
  Filled 2022-11-02: qty 100

## 2022-11-02 MED ORDER — LORAZEPAM 2 MG/ML IJ SOLN: 1.0000 mg | INTRAMUSCULAR | Status: DC | PRN

## 2022-11-02 NOTE — Plan of Care (Signed)
  Problem: Coping: Goal: Level of anxiety will decrease Outcome: Progressing   Problem: Safety: Goal: Ability to remain free from injury will improve Outcome: Progressing   Problem: Skin Integrity: Goal: Risk for impaired skin integrity will decrease Outcome: Progressing   Problem: Pain Management: Goal: Satisfaction with pain management regimen will improve Outcome: Progressing
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
## 2022-11-02 NOTE — Plan of Care (Signed)
  Problem: Safety: Goal: Non-violent Restraint(s) Outcome: Progressing   Problem: Education: Goal: Knowledge of General Education information will improve Description: Including pain rating scale, medication(s)/side effects and non-pharmacologic comfort measures Outcome: Progressing   Problem: Clinical Measurements: Goal: Respiratory complications will improve Outcome: Progressing   Problem: Activity: Goal: Risk for activity intolerance will decrease Outcome: Progressing   Problem: Coping: Goal: Level of anxiety will decrease Outcome: Progressing   Problem: Elimination: Goal: Will not experience complications related to bowel motility Outcome: Progressing

## 2022-11-02 NOTE — Progress Notes (Signed)
DISCHARGE NOTE:   Pt discharged to hospice home, report called to [employer]. Discharged paperwork placed in packet vitals take and within normal range for situation. IV left in per hospice home. Pt calm and cooperative, transportation provided by EMS.

## 2022-11-02 NOTE — Progress Notes (Addendum)
Manufacturing engineer [facility])   Consent forms to be completed @ 11 am at [place] family will tour facility.   EMS to be notified of patient D/C and transport arranged. TOC/[place]il[place] and Attending Physician/Dr. Rande Brunt also notified of transport arrangement.   Please send signed DNR form with patient and RN call report to [phone].   Addendum:  Transport arranged via [facility] by MSW.   Phillis Haggis, MSW [facility] Liaison [phone]
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
## 2022-11-02 NOTE — Progress Notes (Signed)
Nutrition Brief Note  Chart reviewed. Pt now transitioning to comfort care. Plan to discharge to residential hospice once bed is available.  No further nutrition interventions planned at this time.  Please re-consult as needed.   Loistine Chance, RD, LDN, [place] Registered Dietitian II Certified Diabetes Care and Education Specialist Please refer to [facility] for RD and/or RD on-call/weekend/after hours pager

## 2022-11-21 DEATH — deceased
